# Patient Record
Sex: Female | Born: 1945 | Race: Black or African American | Hispanic: No | Marital: Married
Health system: Southern US, Community
[De-identification: ages and names within clinical notes are randomized; demographics above are authoritative.]

---

## 2015-06-15 ENCOUNTER — Other Ambulatory Visit (HOSPITAL_COMMUNITY): Payer: Medicare Other

## 2015-06-15 ENCOUNTER — Inpatient Hospital Stay
Admission: AD | Admit: 2015-06-15 | Discharge: 2015-07-25 | Disposition: A | Discharge status: Home Health | Payer: Medicare Other | Source: Ambulatory Visit | Attending: Internal Medicine | Admitting: Internal Medicine

## 2015-06-15 DIAGNOSIS — K567 Ileus, unspecified: Secondary | ICD-10-CM

## 2015-06-15 DIAGNOSIS — Z09 Encounter for follow-up examination after completed treatment for conditions other than malignant neoplasm: Secondary | ICD-10-CM

## 2015-06-15 DIAGNOSIS — Z95828 Presence of other vascular implants and grafts: Secondary | ICD-10-CM

## 2015-06-15 DIAGNOSIS — Z9911 Dependence on respirator [ventilator] status: Secondary | ICD-10-CM

## 2015-06-15 DIAGNOSIS — Z431 Encounter for attention to gastrostomy: Secondary | ICD-10-CM

## 2015-06-15 DIAGNOSIS — N390 Urinary tract infection, site not specified: Secondary | ICD-10-CM

## 2015-06-15 DIAGNOSIS — G4733 Obstructive sleep apnea (adult) (pediatric): Secondary | ICD-10-CM

## 2015-06-15 DIAGNOSIS — J9620 Acute and chronic respiratory failure, unspecified whether with hypoxia or hypercapnia: Secondary | ICD-10-CM

## 2015-06-15 DIAGNOSIS — R1310 Dysphagia, unspecified: Secondary | ICD-10-CM

## 2015-06-15 DIAGNOSIS — J690 Pneumonitis due to inhalation of food and vomit: Secondary | ICD-10-CM

## 2015-06-15 DIAGNOSIS — R627 Adult failure to thrive: Secondary | ICD-10-CM

## 2015-06-15 DIAGNOSIS — Z4659 Encounter for fitting and adjustment of other gastrointestinal appliance and device: Secondary | ICD-10-CM

## 2015-06-15 DIAGNOSIS — J9690 Respiratory failure, unspecified, unspecified whether with hypoxia or hypercapnia: Secondary | ICD-10-CM

## 2015-06-15 DIAGNOSIS — I2699 Other pulmonary embolism without acute cor pulmonale: Secondary | ICD-10-CM

## 2015-06-15 DIAGNOSIS — K639 Disease of intestine, unspecified: Secondary | ICD-10-CM

## 2015-06-15 DIAGNOSIS — Z931 Gastrostomy status: Secondary | ICD-10-CM

## 2015-06-15 DIAGNOSIS — E46 Unspecified protein-calorie malnutrition: Secondary | ICD-10-CM

## 2015-06-15 LAB — BLOOD GAS, ARTERIAL
ACID-BASE EXCESS: 5.2 mmol/L — AB (ref 0.0–2.0)
Bicarbonate: 27.8 mEq/L — ABNORMAL HIGH (ref 20.0–24.0)
FIO2: 0.3
LHR: 14 {breaths}/min
O2 SAT: 94.2 %
PCO2 ART: 30.7 mmHg — AB (ref 35.0–45.0)
PEEP: 5 cmH2O
PH ART: 7.565 — AB (ref 7.350–7.450)
Patient temperature: 98.6
TCO2: 28.7 mmol/L (ref 0–100)
VT: 570 mL
pO2, Arterial: 62.7 mmHg — ABNORMAL LOW (ref 80.0–100.0)

## 2015-06-16 ENCOUNTER — Other Ambulatory Visit (HOSPITAL_COMMUNITY): Payer: Medicare Other

## 2015-06-16 DIAGNOSIS — J9600 Acute respiratory failure, unspecified whether with hypoxia or hypercapnia: Secondary | ICD-10-CM | POA: Diagnosis not present

## 2015-06-16 DIAGNOSIS — I2609 Other pulmonary embolism with acute cor pulmonale: Secondary | ICD-10-CM

## 2015-06-16 DIAGNOSIS — N390 Urinary tract infection, site not specified: Secondary | ICD-10-CM

## 2015-06-16 DIAGNOSIS — J9690 Respiratory failure, unspecified, unspecified whether with hypoxia or hypercapnia: Secondary | ICD-10-CM | POA: Insufficient documentation

## 2015-06-16 DIAGNOSIS — J690 Pneumonitis due to inhalation of food and vomit: Secondary | ICD-10-CM

## 2015-06-16 DIAGNOSIS — R627 Adult failure to thrive: Secondary | ICD-10-CM

## 2015-06-16 DIAGNOSIS — G4733 Obstructive sleep apnea (adult) (pediatric): Secondary | ICD-10-CM

## 2015-06-16 DIAGNOSIS — Z9911 Dependence on respirator [ventilator] status: Secondary | ICD-10-CM

## 2015-06-16 DIAGNOSIS — I2699 Other pulmonary embolism without acute cor pulmonale: Secondary | ICD-10-CM

## 2015-06-16 LAB — BLOOD GAS, ARTERIAL
Acid-Base Excess: 2.4 mmol/L — ABNORMAL HIGH (ref 0.0–2.0)
Bicarbonate: 26.1 mEq/L — ABNORMAL HIGH (ref 20.0–24.0)
FIO2: 0.35
LHR: 14 {breaths}/min
MECHVT: 420 mL
O2 Saturation: 98.7 %
PH ART: 7.448 (ref 7.350–7.450)
Patient temperature: 98.6
TCO2: 27.3 mmol/L (ref 0–100)
pCO2 arterial: 38.3 mmHg (ref 35.0–45.0)
pO2, Arterial: 121 mmHg — ABNORMAL HIGH (ref 80.0–100.0)

## 2015-06-16 LAB — COMPREHENSIVE METABOLIC PANEL
ALBUMIN: 3.1 g/dL — AB (ref 3.5–5.0)
ALT: 60 U/L — AB (ref 14–54)
AST: 96 U/L — AB (ref 15–41)
Alkaline Phosphatase: 48 U/L (ref 38–126)
Anion gap: 10 (ref 5–15)
BILIRUBIN TOTAL: 0.8 mg/dL (ref 0.3–1.2)
BUN: 12 mg/dL (ref 6–20)
CHLORIDE: 98 mmol/L — AB (ref 101–111)
CO2: 27 mmol/L (ref 22–32)
Calcium: 8.6 mg/dL — ABNORMAL LOW (ref 8.9–10.3)
Creatinine, Ser: 0.59 mg/dL (ref 0.44–1.00)
GFR calc Af Amer: >60 mL/min (ref >60)
GFR calc non Af Amer: >60 mL/min (ref >60)
Glucose, Bld: 82 mg/dL (ref 65–99)
POTASSIUM: 2.7 mmol/L — AB (ref 3.5–5.1)
Sodium: 135 mmol/L (ref 135–145)
TOTAL PROTEIN: 6 g/dL — AB (ref 6.5–8.1)

## 2015-06-16 LAB — CBC
HEMATOCRIT: 28.2 % — AB (ref 36.0–46.0)
Hemoglobin: 8.7 g/dL — ABNORMAL LOW (ref 12.0–15.0)
MCH: 26.6 pg (ref 26.0–34.0)
MCHC: 30.9 g/dL (ref 30.0–36.0)
MCV: 86.2 fL (ref 78.0–100.0)
PLATELETS: 301 10*3/uL (ref 150–400)
RBC: 3.27 MIL/uL — AB (ref 3.87–5.11)
RDW: 14.1 % (ref 11.5–15.5)
WBC: 6 10*3/uL (ref 4.0–10.5)

## 2015-06-16 LAB — PROTIME-INR
INR: 1.24 (ref 0.00–1.49)
PROTHROMBIN TIME: 15.7 s — AB (ref 11.6–15.2)

## 2015-06-16 NOTE — Consult Note (Signed)
Name: Kaylee Olsen MRN: [id number] DOB: [birth year]    ADMISSION DATE:  06/15/2015 CONSULTATION DATE:  10/28  REFERRING MD :  [facility]  CHIEF COMPLAINT:  FTT , VDRF  BRIEF PATIENT DESCRIPTION: 69 yo female intubated  SIGNIFICANT EVENTS  10/28 tx to [facility]  STUDIES:  pcxr rt base infiltrate / atx   HISTORY OF PRESENT ILLNESS:  69 yo aaf who developed sob on 04/11/15 and was found to have PE and required intubation and treatment at [facility]. Dx with OSA post extubation. She required 6 weeks of rehab and never fully recovered. Home for4 days and her cpap was never set up. She desaturated and was readmitted and intubated for second time 04/09/15. Extubated 10/25 and required reintubation 10/27 and transferred to [facility] 10/27 and PCCM consulted 10/28 for vent management. She will need early   PAST MEDICAL HISTORY :     Ventilator dependence ([place]). intubated x 3 over last 3 months. extubated 10/24 ab=nd reintubated 06/14/20   Pulmonary embolus (HCC)dx in august 2016. [facility]QU[facility]UBATION AT [facility] [place]   Aspiration pneumonia ([place]) aspirated last intubation 10/27   OSA (obstructive sleep apnea) needs cpap if extubated    FTT (failure to thrive) in adult, continual decline sine august 2016   UTI (lower urinary tract infection) with e coli   Prior to Admission medications   Reviewed    Allergies no known allergies  FAMILY HISTORY:  family history is not on file.unable to obtain and NOT on transfer summary SOCIAL HISTORY:  never smoker unknown, unable  REVIEW OF SYSTEMS:   Unable ETT, no family  SUBJECTIVE: remains vented, alkalotic  VITAL SIGNS:  112/62  72 sr  rr 14 with vent 98.9 sats 100%  PHYSICAL EXAMINATION: General: Frail elderly AAF , on vent  Neuro: Follows commands weakly, unable to track, mae x 4 HEENT:  No JVD LAN Cardiovascular:  HSR RRR Lungs:  Decreased bs bases Abdomen: Soft + bs Musculoskeletal:  Wasted musculature, early arm contractures  Skin:  Warm and  dry.smvs   No results for input(s): NA, K, CL, CO2, BUN, CREATININE, GLUCOSE in the last 168 hours.  Recent Labs Lab 06/16/15 0528  HGB 8.7*  HCT 28.2*  WBC 6.0  PLT 301   Dg Chest Port 1 View  06/16/2015  CLINICAL DATA:  Respiratory failure, clinical pneumonia, intubated patient. EXAM: PORTABLE CHEST 1 VIEW COMPARISON:  None in PACs FINDINGS: The left lung is well-expanded and clear. On the right increased airspace opacity obscures the hemidiaphragm and extends toward the right infrahilar region. The heart and pulmonary vascularity are normal. The mediastinum is normal in width. The endotracheal tube tip earlier X 3.7 cm above the carina. The esophagogastric tube tip projects below the inferior margin of the image. The right-sided PICC line tip projects over the midportion of the SVC. IMPRESSION: Right lower lobe atelectasis or pneumonia. The support tubes are in reasonable position. Electronically Signed   By: David  Martinique M.D.   On: 06/16/2015 07:18   Dg Abd Portable 1v  06/15/2015  CLINICAL DATA:  Nasogastric tube placement. EXAM: PORTABLE ABDOMEN - 1 VIEW COMPARISON:  None. FINDINGS: Normal bowel gas pattern. Nasogastric tube tip in the region of the gastric pylorus. Prominent stool in the rectum. Unremarkable bones. IMPRESSION: 1. Nasogastric tube tip in the region of the gastric pylorus. 2. Prominent stool in the rectum. Electronically Signed   By: Claudie Revering M.D.   On: 06/15/2015 17:38    ASSESSMENT  Ventilator dependence ([place]). intubated x 3 over last 3 months. extubated 10/24 abnd reintubated 06/14/20   Pulmonary embolus (HCC)dx in august 2016. Required intubation at [facility]. 6 weeks of rehab   Aspiration pneumonia ([place]) aspirated last intubation 10/27   OSA (obstructive sleep apnea) needs cpap if extubated    FTT (failure to thrive) in adult, continual decline sine august 2016   UTI (lower urinary tract infection) with e coli Supra nuclear palsy     PLAN: Wean  diprivan to off Suggest early tracheostomy due profound weakness, proven inability to extubate successfully  Continue anticoagulation for pe  Abx per [facility] for aspiration  Will need cpap if extubated  PT/OT that [facility] does so well in apparent CIP  UTI treatment per [facility].   Richardson Landry Minor ACNP Maryanna Shape PCCM Pager [phone] till 3 pm If no answer page [phone] 06/16/2015, 10:27 AM   .STAFF NOTE: I, Merrie Roof, MD FACP have personally reviewed patient's available data, including medical history, events of note, physical examination and test results as part of my evaluation. I have discussed with resident/NP and other care providers such as pharmacist, RN and RRT. In addition, I personally evaluated patient and elicited key findings of: I interviewed husband, she has coarse BS, she does not follow commands, no edema on examination, ronchi worse rt base and reduced, he described her ambulating in between 1-2 extubation attempt, he also does not describe a grave prognosis about her SNP, seems the second extubation failure was despite BIPAP use, ABg reviewed and pcxr, abx per primary, reduce Tv by 1 cc/kg to 420, repeat abg, then sbt planned cpap 5 ps 5, goal 2 hr, NO extubation planned, she requires trach, PCCM not doing trach now at [facility] for now, please call ENT, would follow rt base infiltrate, rass goal 0, need copy of MRI brain and neuro evalution to confirm prognosis SNP, d/w RT, repeat abg for alk with above reduction MV The patient is critically ill with multiple organ systems failure and requires high complexity decision making for assessment and support, frequent evaluation and titration of therapies, application of advanced monitoring technologies and extensive interpretation of multiple databases.   Critical Care Time devoted to patient care services described in this note is35 Minutes. This time reflects time of care of this signee: Merrie Roof, MD FACP. This critical care time  does not reflect procedure time, or teaching time or supervisory time of PA/NP/Med student/Med Resident etc but could involve care discussion time. Rest per NP/medical resident whose note is outlined above and that I agree with   Lavon Paganini. Titus Mould, MD, [place] Pgr: [place] [facility] 06/16/2015 10:42 AM

## 2015-06-17 LAB — BASIC METABOLIC PANEL
ANION GAP: 7 (ref 5–15)
BUN: 10 mg/dL (ref 6–20)
CALCIUM: 8.3 mg/dL — AB (ref 8.9–10.3)
CHLORIDE: 100 mmol/L — AB (ref 101–111)
CO2: 27 mmol/L (ref 22–32)
CREATININE: 0.54 mg/dL (ref 0.44–1.00)
GFR calc Af Amer: >60 mL/min (ref >60)
GLUCOSE: 85 mg/dL (ref 65–99)
POTASSIUM: 3.2 mmol/L — AB (ref 3.5–5.1)
Sodium: 134 mmol/L — ABNORMAL LOW (ref 135–145)

## 2015-06-17 LAB — MAGNESIUM: Magnesium: 1.8 mg/dL (ref 1.7–2.4)

## 2015-06-18 LAB — BASIC METABOLIC PANEL
Anion gap: 9 (ref 5–15)
BUN: 11 mg/dL (ref 6–20)
CHLORIDE: 99 mmol/L — AB (ref 101–111)
CO2: 29 mmol/L (ref 22–32)
Calcium: 9 mg/dL (ref 8.9–10.3)
Creatinine, Ser: 0.55 mg/dL (ref 0.44–1.00)
Glucose, Bld: 100 mg/dL — ABNORMAL HIGH (ref 65–99)
POTASSIUM: 4.1 mmol/L (ref 3.5–5.1)
SODIUM: 137 mmol/L (ref 135–145)

## 2015-06-19 ENCOUNTER — Other Ambulatory Visit (HOSPITAL_COMMUNITY): Payer: Medicare Other

## 2015-06-19 DIAGNOSIS — J9620 Acute and chronic respiratory failure, unspecified whether with hypoxia or hypercapnia: Secondary | ICD-10-CM | POA: Diagnosis not present

## 2015-06-19 DIAGNOSIS — G4733 Obstructive sleep apnea (adult) (pediatric): Secondary | ICD-10-CM | POA: Diagnosis not present

## 2015-06-19 DIAGNOSIS — Z9911 Dependence on respirator [ventilator] status: Secondary | ICD-10-CM | POA: Diagnosis not present

## 2015-06-19 DIAGNOSIS — J690 Pneumonitis due to inhalation of food and vomit: Secondary | ICD-10-CM | POA: Diagnosis not present

## 2015-06-19 LAB — BASIC METABOLIC PANEL
Anion gap: 8 (ref 5–15)
BUN: 14 mg/dL (ref 6–20)
CHLORIDE: 100 mmol/L — AB (ref 101–111)
CO2: 28 mmol/L (ref 22–32)
CREATININE: 0.47 mg/dL (ref 0.44–1.00)
Calcium: 9.1 mg/dL (ref 8.9–10.3)
GFR calc Af Amer: >60 mL/min (ref >60)
GFR calc non Af Amer: >60 mL/min (ref >60)
GLUCOSE: 99 mg/dL (ref 65–99)
POTASSIUM: 3.9 mmol/L (ref 3.5–5.1)
SODIUM: 136 mmol/L (ref 135–145)

## 2015-06-19 LAB — CBC
HCT: 28.8 % — ABNORMAL LOW (ref 36.0–46.0)
HEMOGLOBIN: 8.8 g/dL — AB (ref 12.0–15.0)
MCH: 26.7 pg (ref 26.0–34.0)
MCHC: 30.6 g/dL (ref 30.0–36.0)
MCV: 87.3 fL (ref 78.0–100.0)
Platelets: 324 10*3/uL (ref 150–400)
RBC: 3.3 MIL/uL — AB (ref 3.87–5.11)
RDW: 14.8 % (ref 11.5–15.5)
WBC: 4.8 10*3/uL (ref 4.0–10.5)

## 2015-06-19 LAB — MAGNESIUM: Magnesium: 1.9 mg/dL (ref 1.7–2.4)

## 2015-06-19 LAB — BRAIN NATRIURETIC PEPTIDE: B Natriuretic Peptide: 33.4 pg/mL (ref 0.0–100.0)

## 2015-06-19 NOTE — Progress Notes (Signed)
PCCM PROGRESS NOTE  ADMISSION DATE: 06/15/2015 CONSULT DATE: 06/16/2015 REFERRING PROVIDER: Hijazi  CC: Respiratory failure  SUBJECTIVE: She denies chest, or abdominal pain.  Not able to tolerate pressure support weaning this AM.  OBJECTIVE: Vitals: RR 14, Vt 420, FiO2 28%, PEEP5, BP 128/69, HR 71, SpO2 100%  General: ill appearing HEENT: ETT in place Cardiac: regular Chest: scattered rhonchi Abd: soft, non tender Ext: no edema Neuro: follows commands, moves extremities Skin: no rashes   CMP Latest Ref Rng 06/19/2015 06/18/2015 06/17/2015  Glucose 65 - 99 mg/dL 99 161(W100(H) 85  BUN 6 - 20 mg/dL 14 11 10   Creatinine 0.44 - 1.00 mg/dL 9.600.47 4.540.55 0.980.54  Sodium 135 - 145 mmol/L 136 137 134(L)  Potassium 3.5 - 5.1 mmol/L 3.9 4.1 3.2(L)  Chloride 101 - 111 mmol/L 100(L) 99(L) 100(L)  CO2 22 - 32 mmol/L 28 29 27   Calcium 8.9 - 10.3 mg/dL 9.1 9.0 1.1(B8.3(L)  Total Protein 6.5 - 8.1 g/dL - - -  Total Bilirubin 0.3 - 1.2 mg/dL - - -  Alkaline Phos 38 - 126 U/L - - -  AST 15 - 41 U/L - - -  ALT 14 - 54 U/L - - -     CBC Latest Ref Rng 06/19/2015 06/16/2015  WBC 4.0 - 10.5 K/uL 4.8 6.0  Hemoglobin 12.0 - 15.0 g/dL 1.4(N8.8(L) 8.2(N8.7(L)  Hematocrit 36.0 - 46.0 % 28.8(L) 28.2(L)  Platelets 150 - 400 K/uL 324 301     Dg Chest Port 1 View  06/19/2015  CLINICAL DATA:  Respiratory failure. EXAM: PORTABLE CHEST 1 VIEW COMPARISON:  06/16/2015 . FINDINGS: Endotracheal tube and NG tube in stable position. Right PICC line stable position. Mediastinum and hilar structures are stable. Left lower lobe atelectasis and/or through noted. Interim partial clearing of right lower lobe atelectasis. Small left pleural effusion. No pneumothorax. IMPRESSION: 1. Lines and tubes in stable position. 2. Left lower lobe atelectasis and/or infiltrate with small left pleural effusion. Interim partial clearing of right lower lobe atelectasis. Electronically Signed   By: Maisie Fushomas  Register   On: 06/19/2015 07:21     STUDIES:  EVENTS: 08/23 Admit [facility] with PE, VDRF 09/21 Re-intubated 10/25 Extubated 10/27 Re-intubated  10[facility]  DISCUSSION: 69 yo female with respiratory failure in setting of PE, failure to thrive, UTI, aspiration pneumonia, supranuclear palsy.  ASSESSMENT/PLAN:  Acute on chronic respiratory failure. Failure to wean from ventilator. Hx of OSA. Plan: - discussed tracheostomy with pt's husband >> he is agreeable - have asked primary team to arrange for ENT consult to set up tracheostomy - pressure support wean as tolerated - f/u CXR intermittently  Aspiration pneumonia. Plan: - levaquin per primary team  Supranuclear palsy. Hx of PE. Deconditioning with failure to thrive. Plan: - per primary team.  Updated pt's husband at bedside.  D/w Dr. Stanton KidneyFortkort.   Coralyn HellingVineet Cecilee Rosner, MD [facility] 06/19/2015, 9:58 AM Pager:  [phone] After 3pm call: [phone]

## 2015-06-21 ENCOUNTER — Other Ambulatory Visit (HOSPITAL_COMMUNITY): Payer: Medicare Other

## 2015-06-22 DIAGNOSIS — I2609 Other pulmonary embolism with acute cor pulmonale: Secondary | ICD-10-CM | POA: Diagnosis not present

## 2015-06-22 DIAGNOSIS — J690 Pneumonitis due to inhalation of food and vomit: Secondary | ICD-10-CM | POA: Diagnosis not present

## 2015-06-22 DIAGNOSIS — Z9911 Dependence on respirator [ventilator] status: Secondary | ICD-10-CM | POA: Diagnosis not present

## 2015-06-22 DIAGNOSIS — J9620 Acute and chronic respiratory failure, unspecified whether with hypoxia or hypercapnia: Secondary | ICD-10-CM | POA: Diagnosis not present

## 2015-06-22 NOTE — Consult Note (Unsigned)
NAMNanci Pina:  Faulk, Kaylee                  ACCOUNT NO.:  [id number]  MEDICAL RECORD NO.:  [id number]  LOCATION:                               FACILITY:  [facility]  PHYSICIAN:  Kristine GarbeChristopher E. Ezzard StandingNewman, M.D.DATE OF BIRTH:  [birth year]  DATE OF CONSULTATION:  06/20/2015 DATE OF DISCHARGE:                                CONSULTATION   REASON FOR CONSULTATION:  Evaluate the patient for tracheostomy.  INDICATION/BRIEF HISTORY:  Kaylee Olsen is a 69 year old female who has had recent history of pulmonary embolus and pneumonia with previous history of obstructive sleep apnea and supranuclear palsy.  She was initially tried on BiPAP at outside hospital, but eventually required intubation.  Several attempts at extubation were unsuccessful requiring re-intubation.  The [facility], intubated.  Tracheostomy was recommended. The patient is presently on Lovenox subcu b.i.d., hemoglobin of 8.8.  On exam at bedside, the patient is intubated on the vent.  Neck is moderate-sized with the trach midline with no neck masses noted.  Discussed with husband concerning the patient's medical history and need for tracheotomy and he understands.  IMPRESSION: 1. Vent-dependent respiratory failure. 2. Obstructive sleep apnea. 3. Supranuclear palsy. 4. History of pulmonary embolism and pneumonia.  PLAN:  We will plan on scheduling the patient for tracheotomy later this week, presently scheduled for Friday at 9:30 a.m.  We will need to hold Lovenox 24 hours prior to tracheotomy.          ______________________________ Kristine Garbehristopher E. Ezzard StandingNewman, M.D.     CEN/MEDQ  D:  06/20/2015  T:  06/21/2015  Job:  [id number]

## 2015-06-22 NOTE — Progress Notes (Signed)
PCCM PROGRESS NOTE  ADMISSION DATE: 06/15/2015 CONSULT DATE: 06/16/2015 REFERRING PROVIDER: Hijazi  CC: Respiratory failure  SUBJECTIVE: She denies chest, or abdominal pain.  Not able to tolerate pressure support weaning this AM.  OBJECTIVE: Vitals: Reviewed at bedside - abnormal values discussed in A/P.   General: ill appearing, NAD on vent  HEENT: ETT in place Cardiac: regular Chest: resps even no labored on vent, scattered rhonchi Abd: soft, non tender Ext: no edema Neuro: follows commands, moves extremities, nods appropriately Skin: no rashes   CMP Latest Ref Rng 06/19/2015 06/18/2015 06/17/2015  Glucose 65 - 99 mg/dL 99 960(A) 85  BUN 6 - 20 mg/dL Creatinine 0.44 - 1.00 mg/dL 5.40 9.81 1.91  Sodium 135 - 145 mmol/L 136 137 134(L)  Potassium 3.5 - 5.1 mmol/L 3.9 4.1 3.2(L)  Chloride 101 - 111 mmol/L 100(L) 99(L) 100(L)  CO2 22 - 32 mmol/L Calcium 8.9 - 10.3 mg/dL 9.1 9.0 4.7(W)  Total Protein 6.5 - 8.1 g/dL - - -  Total Bilirubin 0.3 - 1.2 mg/dL - - -  Alkaline Phos 38 - 126 U/L - - -  AST 15 - 41 U/L - - -  ALT 14 - 54 U/L - - -     CBC Latest Ref Rng 06/19/2015 06/16/2015  WBC 4.0 - 10.5 K/uL 4.8 6.0  Hemoglobin 12.0 - 15.0 g/dL 2.9(F) 6.2(Z)  Hematocrit 36.0 - 46.0 % 28.8(L) 28.2(L)  Platelets 150 - 400 K/uL 324 301     Dg Chest Port 1 View  06/21/2015  CLINICAL DATA:  69 year old female with a history of respiratory failure EXAM: PORTABLE CHEST 1 VIEW COMPARISON:  06/19/2015 FINDINGS: Cardiomediastinal silhouette unchanged in size and contour. Re- demonstration of endotracheal tube terminating suitably above the carina, approximately 3 cm. Gastric tube projects over the mediastinum, and terminates in the left upper quadrant. Low lung volumes with mixed linear and airspace opacities, similar to the comparison. No pneumothorax. IMPRESSION: Unchanged position of endotracheal tube, terminating suitably above the carina. Unchanged gastric  tube. Similar appearance of mixed interstitial and airspace disease of the bilateral lungs. Signed, Yvone Neu. Loreta Ave, DO [facility] Electronically Signed   By: Gilmer Mor D.O.   On: 06/21/2015 15:13   Dg Abd Portable 1v  06/21/2015  CLINICAL DATA:  Placement of orogastric tube EXAM: PORTABLE ABDOMEN - 1 VIEW COMPARISON:  None. FINDINGS: Orogastric tube tip and side port are in the stomach. Rectal thermometer is in place. There is no appreciable bowel dilatation or air-fluid levels. Note that there is fold thickening in a portion of the transverse colon. No free air is seen on this supine examination. There is atelectasis in the left lung base. IMPRESSION: Orogastric tube tip and side port in stomach. No obstruction or free air is evident. Note that there is mild wall thickening in portions of the mid to distal transverse colon. This finding could indicate a degree of inflammation or even early ischemia. Close clinical surveillance advised. This finding may warrant contrast enhanced CT abdomen and pelvis if symptoms and wall thickening persists radiographically. Electronically Signed   By: Bretta Bang III M.D.   On: 06/21/2015 15:13    STUDIES:  EVENTS: 08/23 Admit [facility] with PE, VDRF 09/21 Re-intubated 10/25 Extubated 10/27 Re-intubated  10/28 To [facility]  DISCUSSION: 69 yo female with respiratory failure in setting of PE, failure to thrive, UTI, aspiration pneumonia, supranuclear palsy.  ASSESSMENT/PLAN:  Acute on chronic  respiratory failure. Failure to wean from ventilator. Hx of OSA. Plan: - plan for ENT trach 11/4 - pressure support wean as tol, progress to attempts at ATC wean once trach in place  - f/u CXR intermittently  Aspiration pneumonia. Plan: - levaquin per primary team  Supranuclear palsy. Hx of PE. Deconditioning with failure to thrive. Plan: - per primary team.    Kaylee DressKaty Whiteheart, NP 06/22/2015  9:27  AM Pager: [phone] or [phone]  Attending Note:  I have examined patient, reviewed labs, studies and notes. I have discussed the case with Jasper RilingK Olsen, and I agree with the data and plans as amended above.   Levy Pupaobert Brentton Wardlow, MD, PhD 06/22/2015, 10:19 AM [place] [facility] [phone] or if no answer [phone]

## 2015-06-23 ENCOUNTER — Other Ambulatory Visit (HOSPITAL_COMMUNITY): Payer: Medicare Other

## 2015-06-23 ENCOUNTER — Inpatient Hospital Stay
Admission: AD | Disposition: A | Discharge status: Home Health | Payer: Self-pay | Source: Ambulatory Visit | Attending: Internal Medicine

## 2015-06-23 ENCOUNTER — Encounter (HOSPITAL_COMMUNITY): Payer: Medicare Other | Admitting: Anesthesiology

## 2015-06-23 DIAGNOSIS — K639 Disease of intestine, unspecified: Secondary | ICD-10-CM | POA: Insufficient documentation

## 2015-06-23 HISTORY — PX: TRACHEOSTOMY TUBE PLACEMENT: SHX814

## 2015-06-23 LAB — BASIC METABOLIC PANEL
Anion gap: 9 (ref 5–15)
BUN: 11 mg/dL (ref 6–20)
CALCIUM: 9.2 mg/dL (ref 8.9–10.3)
CO2: 28 mmol/L (ref 22–32)
CREATININE: 0.54 mg/dL (ref 0.44–1.00)
Chloride: 97 mmol/L — ABNORMAL LOW (ref 101–111)
GFR calc Af Amer: >60 mL/min (ref >60)
GLUCOSE: 97 mg/dL (ref 65–99)
Potassium: 3.9 mmol/L (ref 3.5–5.1)
Sodium: 134 mmol/L — ABNORMAL LOW (ref 135–145)

## 2015-06-23 LAB — CBC
HCT: 29.5 % — ABNORMAL LOW (ref 36.0–46.0)
HEMOGLOBIN: 9.2 g/dL — AB (ref 12.0–15.0)
MCH: 27 pg (ref 26.0–34.0)
MCHC: 31.2 g/dL (ref 30.0–36.0)
MCV: 86.5 fL (ref 78.0–100.0)
PLATELETS: 362 10*3/uL (ref 150–400)
RBC: 3.41 MIL/uL — ABNORMAL LOW (ref 3.87–5.11)
RDW: 14.9 % (ref 11.5–15.5)
WBC: 6.2 10*3/uL (ref 4.0–10.5)

## 2015-06-23 SURGERY — CREATION, TRACHEOSTOMY
Anesthesia: General | Site: Neck

## 2015-06-23 MED ORDER — ONDANSETRON HCL 4 MG/2ML IJ SOLN
INTRAMUSCULAR | Status: DC | PRN
  Administered 2015-06-23: 4 mg via INTRAVENOUS

## 2015-06-23 MED ORDER — LIDOCAINE HCL (CARDIAC) 20 MG/ML IV SOLN
INTRAVENOUS | Status: DC | PRN
  Administered 2015-06-23: 60 mg via INTRAVENOUS

## 2015-06-23 MED ORDER — PROPOFOL 10 MG/ML IV BOLUS
INTRAVENOUS | Status: DC | PRN
  Administered 2015-06-23: 30 mg via INTRAVENOUS
  Administered 2015-06-23: 20 mg via INTRAVENOUS
  Administered 2015-06-23: 30 mg via INTRAVENOUS

## 2015-06-23 MED ORDER — LIDOCAINE-EPINEPHRINE 1 %-1:100000 IJ SOLN
INTRAMUSCULAR | Status: DC | PRN
  Administered 2015-06-23: 5 mL

## 2015-06-23 MED ORDER — LACTATED RINGERS IV SOLN
INTRAVENOUS | Status: DC | PRN
  Administered 2015-06-23: 10:00:00 via INTRAVENOUS

## 2015-06-23 MED ORDER — FENTANYL CITRATE (PF) 100 MCG/2ML IJ SOLN
INTRAMUSCULAR | Status: DC | PRN
  Administered 2015-06-23: 50 ug via INTRAVENOUS

## 2015-06-23 MED ORDER — 0.9 % SODIUM CHLORIDE (POUR BTL) OPTIME
TOPICAL | Status: DC | PRN
  Administered 2015-06-23: 1000 mL

## 2015-06-23 SURGICAL SUPPLY — 42 items
ATTRACTOMAT 16X20 MAGNETIC DRP (DRAPES) IMPLANT
BLADE 10 SAFETY STRL DISP (BLADE) ×3 IMPLANT
BLADE SURG 15 STRL LF DISP TIS (BLADE) ×1 IMPLANT
BLADE SURG 15 STRL SS (BLADE) ×2
CLEANER TIP ELECTROSURG 2X2 (MISCELLANEOUS) ×3 IMPLANT
COVER SURGICAL LIGHT HANDLE (MISCELLANEOUS) ×3 IMPLANT
DRAPE PROXIMA HALF (DRAPES) IMPLANT
ELECT COATED BLADE 2.86 ST (ELECTRODE) ×3 IMPLANT
ELECT REM PT RETURN 9FT ADLT (ELECTROSURGICAL) ×3
ELECTRODE REM PT RTRN 9FT ADLT (ELECTROSURGICAL) ×1 IMPLANT
GAUZE SPONGE 4X4 16PLY XRAY LF (GAUZE/BANDAGES/DRESSINGS) ×3 IMPLANT
GLOVE BIO SURGEON STRL SZ7 (GLOVE) ×3 IMPLANT
GLOVE BIOGEL PI IND STRL 7.0 (GLOVE) ×1 IMPLANT
GLOVE BIOGEL PI INDICATOR 7.0 (GLOVE) ×2
GLOVE SS BIOGEL STRL SZ 7.5 (GLOVE) ×2 IMPLANT
GLOVE SUPERSENSE BIOGEL SZ 7.5 (GLOVE) ×4
GLOVE SURG SS PI 7.0 STRL IVOR (GLOVE) ×3 IMPLANT
GOWN STRL REUS W/ TWL LRG LVL3 (GOWN DISPOSABLE) ×2 IMPLANT
GOWN STRL REUS W/ TWL XL LVL3 (GOWN DISPOSABLE) ×1 IMPLANT
GOWN STRL REUS W/TWL LRG LVL3 (GOWN DISPOSABLE) ×4
GOWN STRL REUS W/TWL XL LVL3 (GOWN DISPOSABLE) ×2
HOLDER TRACH TUBE VELCRO 19.5 (MISCELLANEOUS) ×3 IMPLANT
KIT BASIN OR (CUSTOM PROCEDURE TRAY) ×3 IMPLANT
KIT ROOM TURNOVER OR (KITS) ×3 IMPLANT
KIT SUCTION CATH 14FR (SUCTIONS) ×3 IMPLANT
NEEDLE HYPO 25GX1X1/2 BEV (NEEDLE) IMPLANT
NEEDLE HYPO 25X1 1.5 SAFETY (NEEDLE) ×3 IMPLANT
NS IRRIG 1000ML POUR BTL (IV SOLUTION) ×3 IMPLANT
PACK EENT II TURBAN DRAPE (CUSTOM PROCEDURE TRAY) ×3 IMPLANT
PAD ARMBOARD 7.5X6 YLW CONV (MISCELLANEOUS) ×6 IMPLANT
PENCIL BUTTON HOLSTER BLD 10FT (ELECTRODE) ×3 IMPLANT
SPONGE DRAIN TRACH 4X4 STRL 2S (GAUZE/BANDAGES/DRESSINGS) ×3 IMPLANT
SPONGE INTESTINAL PEANUT (DISPOSABLE) ×3 IMPLANT
SUT SILK 2 0 SH CR/8 (SUTURE) ×3 IMPLANT
SUT SILK 3 0 TIES 10X30 (SUTURE) ×3 IMPLANT
SYR 20CC LL (SYRINGE) ×3 IMPLANT
SYR CONTROL 10ML LL (SYRINGE) ×3 IMPLANT
TUBE CONNECTING 12'X1/4 (SUCTIONS) ×1
TUBE CONNECTING 12X1/4 (SUCTIONS) ×2 IMPLANT
TUBE TRACH SHILEY  6 DIST  CUF (TUBING) ×3 IMPLANT
TUBE TRACH SHILEY 10 DIST CUFF (TUBING) IMPLANT
TUBE TRACH SHILEY 8 DIST CUF (TUBING) IMPLANT

## 2015-06-23 NOTE — Brief Op Note (Signed)
06/15/2015 - 06/23/2015  10:14 AM  PATIENT:  Kaylee Olsen  69 y.o. female  PRE-OPERATIVE DIAGNOSIS:  respiratory failure  POST-OPERATIVE DIAGNOSIS:  respiratory failure  PROCEDURE:  Procedure(s): TRACHEOSTOMY (N/A)   # 6 [place]  SURGEON:  Surgeon(s) and Role:    * Drema Halonhristopher E Newman, MD - Primary  PHYSICIAN ASSISTANT:   ASSISTANTS: none   ANESTHESIA:   general  EBL:     BLOOD ADMINISTERED:none  DRAINS: none   LOCAL MEDICATIONS USED:  LIDOCAINE with EPI 5 cc  SPECIMEN:  No Specimen  DISPOSITION OF SPECIMEN:  N/A  COUNTS:  YES  TOURNIQUET:  * No tourniquets in log *  DICTATION: .Other Dictation: Dictation Number [id number]  PLAN OF CARE: Discharge to home after PACU  PATIENT DISPOSITION:  PACU - hemodynamically stable.   Delay start of Pharmacological VTE agent (>24hrs) due to surgical blood loss or risk of bleeding: not applicable

## 2015-06-23 NOTE — Progress Notes (Signed)
[facility] w/ no apparent complications and handed pt off to [facility]. VSS, trach secure.

## 2015-06-23 NOTE — Progress Notes (Signed)
Pt s/p trach from OR, now in PACU for observation.  Pt placed on vent setting that [facility] was [facility] prior to procedure.  Pt appears to be tol well, VSS, no distress noted.  Obturator at bedside. [facility] aware.

## 2015-06-23 NOTE — Interval H&P Note (Signed)
History and Physical Interval Note:  06/23/2015 9:17 AM  Kaylee Olsen  has presented today for surgery, with the diagnosis of respiratory failure  The various methods of treatment have been discussed with the patient and family. After consideration of risks, benefits and other options for treatment, the patient has consented to  Procedure(s): TRACHEOSTOMY (N/A) as a surgical intervention .  The patient's history has been reviewed, patient examined, no change in status, stable for surgery.  I have reviewed the patient's chart and labs.  Questions were answered to the patient's satisfaction.     Kenshawn Maciolek

## 2015-06-23 NOTE — Anesthesia Preprocedure Evaluation (Addendum)
Anesthesia Evaluation  Patient identified by MRN, date of birth, ID band  Reviewed: Allergy & Precautions, NPO status , Patient's Chart, lab work & pertinent test results  Airway Mallampati: Intubated       Dental   Pulmonary sleep apnea , pneumonia,  Hx PE. Vent dependent   breath sounds clear to auscultation       Cardiovascular negative cardio ROS   Rhythm:Regular Rate:Normal     Neuro/Psych Supranuclear palsy  Neuromuscular disease    GI/Hepatic negative GI ROS, Neg liver ROS,   Endo/Other  diabetes, Insulin Dependent  Renal/GU negative Renal ROS     Musculoskeletal   Abdominal   Peds  Hematology  (+) anemia ,   Anesthesia Other Findings   Reproductive/Obstetrics                            Anesthesia Physical Anesthesia Plan  ASA: IV  Anesthesia Plan: General   Post-op Pain Management:    Induction: Intravenous  Airway Management Planned: Oral ETT and Tracheostomy  Additional Equipment:   Intra-op Plan:   Post-operative Plan: Post-operative intubation/ventilation  Informed Consent: I have reviewed the patients History and Physical, chart, labs and discussed the procedure including the risks, benefits and alternatives for the proposed anesthesia with the patient or authorized representative who has indicated his/her understanding and acceptance.     Plan Discussed with: CRNA  Anesthesia Plan Comments:         Anesthesia Quick Evaluation

## 2015-06-23 NOTE — Transfer of Care (Signed)
Immediate Anesthesia Transfer of Care Note  Patient: Kaylee Olsen  Procedure(s) Performed: Procedure(s): TRACHEOSTOMY (N/A)  Patient Location: PACU  Anesthesia Type:General  Level of Consciousness: awake, alert , oriented and sedated  Airway & Oxygen Therapy: Patient Spontanous Breathing and Patient connected to T-piece oxygen  Post-op Assessment: Report given to RN, Post -op Vital signs reviewed and stable and Patient moving all extremities  Post vital signs: Reviewed and unstable  Last Vitals:  Filed Vitals:   06/23/15 1037  BP: 150/59  Pulse: 64  Temp: 36.7 C  Resp: 15    Complications: No apparent anesthesia complications

## 2015-06-23 NOTE — Anesthesia Postprocedure Evaluation (Signed)
  Anesthesia Post-op Note  Patient: Kaylee Olsen  Procedure(s) Performed: Procedure(s): TRACHEOSTOMY (N/A)  Patient Location: PACU  Anesthesia Type:General  Level of Consciousness: awake and sedated  Airway and Oxygen Therapy: Patient placed on Ventilator (see vital sign flow sheet for setting) and on trach  Post-op Pain: none  Post-op Assessment: Post-op Vital signs reviewed              Post-op Vital Signs: Reviewed  Last Vitals:  Filed Vitals:   06/23/15 1108  BP:   Pulse: 71  Temp:   Resp: 14    Complications: No apparent anesthesia complications

## 2015-06-23 NOTE — H&P (Signed)
PREOPERATIVE H&P  Chief Complaint: respiratory failure  HPI: Kaylee Olsen is a 69 y.o. female who presents for evaluation of tracheostomy or respiratory failure. Patient with history of PE, pneumonia, OSA and supranuclear palsy. She's been intubated on a vent for more than a week and has failed two previous extubation trials. She's taken to the OR for tracheostomy.  No past medical history on file. No past surgical history on file. Social History   Social History  . Marital Status: Married    Spouse Name: N/A  . Number of Children: N/A  . Years of Education: N/A   Social History Main Topics  . Smoking status: Not on file  . Smokeless tobacco: Not on file  . Alcohol Use: Not on file  . Drug Use: Not on file  . Sexual Activity: Not on file   Other Topics Concern  . Not on file   Social History Narrative  . No narrative on file   No family history on file. No Known Allergies Prior to Admission medications   Medication Sig Start Date End Date Taking? Authorizing Provider  acetaminophen (TYLENOL) 650 MG suppository Place 650 mg rectally every 6 (six) hours as needed for mild pain or fever.   Yes Historical Provider, MD  albuterol (PROVENTIL) (2.5 MG/3ML) 0.083% nebulizer solution Take 2.5 mg by nebulization every 6 (six) hours as needed for wheezing or shortness of breath.   Yes Historical Provider, MD  enoxaparin (LOVENOX) 60 MG/0.6ML injection Inject 52 mg into the skin every 12 (twelve) hours.   Yes Historical Provider, MD  famotidine (PEPCID) 20 MG tablet Take 20 mg by mouth 2 (two) times daily.   Yes Historical Provider, MD  insulin lispro (HUMALOG) 100 UNIT/ML injection Inject 1 Units into the skin every 6 (six) hours.   Yes Historical Provider, MD  LORazepam (ATIVAN) 2 MG/ML concentrated solution Take 1 mg by mouth every 4 (four) hours as needed for anxiety.   Yes Historical Provider, MD  morphine 2 MG/ML injection Inject 2 mg into the vein every 6 (six) hours as needed  (pain).   Yes Historical Provider, MD  nitroGLYCERIN (NITROSTAT) 0.4 MG SL tablet Place 0.4 mg under the tongue every 5 (five) minutes as needed for chest pain.   Yes Historical Provider, MD  ondansetron (ZOFRAN-ODT) 4 MG disintegrating tablet Take 4 mg by mouth every 8 (eight) hours as needed for nausea or vomiting.   Yes Historical Provider, MD  oxyCODONE (OXY IR/ROXICODONE) 5 MG immediate release tablet Take 5 mg by mouth every 6 (six) hours as needed for severe pain.   Yes Historical Provider, MD  potassium chloride 10 MEQ/50ML Inject 10 mEq into the vein as needed (low potassium).   Yes Historical Provider, MD  senna-docusate (SENOKOT-S) 8.6-50 MG tablet Take 1 tablet by mouth 2 (two) times daily as needed for mild constipation.   Yes Historical Provider, MD     Positive ROS: negative  All other systems have been reviewed and were otherwise negative with the exception of those mentioned in the HPI and as above.  Physical Exam: There were no vitals filed for this visit.  General: Intubated on vent Oral: Normal oral mucosa and tonsils Nasal: Clear nasal passages Neck: No palpable adenopathy or thyroid nodules. Tach midline. Ear: Ear canal is clear with normal appearing TMs Cardiovascular: Regular rate and rhythm, no murmur.  Respiratory: Clear to auscultation Neurologic: Awake but not responsive. Discussion with husband   Assessment/Plan: respiratory failure Plan for Procedure(s): TRACHEOSTOMY  Dillard Cannon, MD 06/23/2015 9:11 AM

## 2015-06-23 NOTE — Consult Note (Signed)
Chief Complaint: Patient was seen in consultation today for percutaneous gastric tube at the request of Dr Kaylee Olsen  Referring Physician(s): DrFortkort  History of Present Illness: Kaylee Olsen is a 69 y.o. female   Hx PE - on coumadin Now off coumadin and on Lovenox Pneumonia Acute on chronic respiratory failure Failing to wean from vent Trach placed 06/23/15 Progressive supranuclear palsy Deconditioning Request for percutaneous gastric tube placement Long term care Low grade temp x few days CT Abd pending to evaluate candidacy for procedure  No past medical history on file.  No past surgical history on file.  Allergies: Review of patient's allergies indicates no known allergies.  Medications: Prior to Admission medications   Medication Sig Start Date End Date Taking? Authorizing Provider  acetaminophen (TYLENOL) 650 MG suppository Place 650 mg rectally every 6 (six) hours as needed for mild pain or fever.   Yes Historical Provider, MD  albuterol (PROVENTIL) (2.5 MG/3ML) 0.083% nebulizer solution Take 2.5 mg by nebulization every 6 (six) hours as needed for wheezing or shortness of breath.   Yes Historical Provider, MD  enoxaparin (LOVENOX) 60 MG/0.6ML injection Inject 52 mg into the skin every 12 (twelve) hours.   Yes Historical Provider, MD  famotidine (PEPCID) 20 MG tablet Take 20 mg by mouth 2 (two) times daily.   Yes Historical Provider, MD  insulin lispro (HUMALOG) 100 UNIT/ML injection Inject 1 Units into the skin every 6 (six) hours.   Yes Historical Provider, MD  LORazepam (ATIVAN) 2 MG/ML concentrated solution Take 1 mg by mouth every 4 (four) hours as needed for anxiety.   Yes Historical Provider, MD  morphine 2 MG/ML injection Inject 2 mg into the vein every 6 (six) hours as needed (pain).   Yes Historical Provider, MD  nitroGLYCERIN (NITROSTAT) 0.4 MG SL tablet Place 0.4 mg under the tongue every 5 (five) minutes as needed for chest pain.   Yes Historical  Provider, MD  ondansetron (ZOFRAN-ODT) 4 MG disintegrating tablet Take 4 mg by mouth every 8 (eight) hours as needed for nausea or vomiting.   Yes Historical Provider, MD  oxyCODONE (OXY IR/ROXICODONE) 5 MG immediate release tablet Take 5 mg by mouth every 6 (six) hours as needed for severe pain.   Yes Historical Provider, MD  potassium chloride 10 MEQ/50ML Inject 10 mEq into the vein as needed (low potassium).   Yes Historical Provider, MD  senna-docusate (SENOKOT-S) 8.6-50 MG tablet Take 1 tablet by mouth 2 (two) times daily as needed for mild constipation.   Yes Historical Provider, MD     No family history on file.  Social History   Social History  . Marital Status: Married    Spouse Name: N/A  . Number of Children: N/A  . Years of Education: N/A   Social History Main Topics  . Smoking status: Not on file  . Smokeless tobacco: Not on file  . Alcohol Use: Not on file  . Drug Use: Not on file  . Sexual Activity: Not on file   Other Topics Concern  . Not on file   Social History Narrative  . No narrative on file     Review of Systems: A 12 point ROS discussed and pertinent positives are indicated in the HPI above.  All other systems are negative.  Review of Systems  Constitutional: Negative for fever.  Respiratory:       Vent/trach    Vital Signs: BP 128/67 mmHg  Pulse 71  Temp(Src) 97.8 F (36.6  C)  Resp 14  SpO2 100%  Physical Exam  Cardiovascular: Normal rate and regular rhythm.   Pulmonary/Chest:  Vent/trach  Abdominal: Soft.  Neurological:  Pt semi alert She is able to nod yes/no Seems appropriate  Skin: Skin is warm and dry.  Psychiatric:  Consented husband at bedside  Nursing note and vitals reviewed.   Mallampati Score:  MD Evaluation Airway: Other (comments) Airway comments: vent/trach Heart: WNL Abdomen: WNL Chest/ Lungs: WNL ASA  Classification: 3 Mallampati/Airway Score: Three  Imaging: Dg Chest Port 1 View  06/21/2015  CLINICAL  DATA:  69 year old female with a history of respiratory failure EXAM: PORTABLE CHEST 1 VIEW COMPARISON:  06/19/2015 FINDINGS: Cardiomediastinal silhouette unchanged in size and contour. Re- demonstration of endotracheal tube terminating suitably above the carina, approximately 3 cm. Gastric tube projects over the mediastinum, and terminates in the left upper quadrant. Low lung volumes with mixed linear and airspace opacities, similar to the comparison. No pneumothorax. IMPRESSION: Unchanged position of endotracheal tube, terminating suitably above the carina. Unchanged gastric tube. Similar appearance of mixed interstitial and airspace disease of the bilateral lungs. Signed, Kaylee Olsen. Kaylee Ave, DO [facility] Electronically Signed   By: Kaylee Olsen D.O.   On: 06/21/2015 15:13   Dg Chest Port 1 View  06/19/2015  CLINICAL DATA:  Respiratory failure. EXAM: PORTABLE CHEST 1 VIEW COMPARISON:  06/16/2015 . FINDINGS: Endotracheal tube and NG tube in stable position. Right PICC line stable position. Mediastinum and hilar structures are stable. Left lower lobe atelectasis and/or through noted. Interim partial clearing of right lower lobe atelectasis. Small left pleural effusion. No pneumothorax. IMPRESSION: 1. Lines and tubes in stable position. 2. Left lower lobe atelectasis and/or infiltrate with small left pleural effusion. Interim partial clearing of right lower lobe atelectasis. Electronically Signed   By: Kaylee Fus  Olsen   On: 06/19/2015 07:21   Dg Chest Port 1 View  06/16/2015  CLINICAL DATA:  Respiratory failure, clinical pneumonia, intubated patient. EXAM: PORTABLE CHEST 1 VIEW COMPARISON:  None in PACs FINDINGS: The left lung is well-expanded and clear. On the right increased airspace opacity obscures the hemidiaphragm and extends toward the right infrahilar region. The heart and pulmonary vascularity are normal. The mediastinum is normal in width.  The endotracheal tube tip earlier X 3.7 cm above the carina. The esophagogastric tube tip projects below the inferior margin of the image. The right-sided PICC line tip projects over the midportion of the SVC. IMPRESSION: Right lower lobe atelectasis or pneumonia. The support tubes are in reasonable position. Electronically Signed   By: David  [place] M.D.   On: 06/16/2015 07:18   Dg Abd Portable 1v  06/23/2015  CLINICAL DATA:  Colonic abnormality. EXAM: PORTABLE ABDOMEN - 1 VIEW COMPARISON:  06/21/2015 FINDINGS: Nasogastric tube is been removed. There is increase gaseous distention of the colon consistent with ileus. No further suggestion of over colonic wall thickening. The colon is likely very redundant. No small bowel dilatation identified with some air seen in nondilated small bowel loops. IMPRESSION: Increased gaseous distention of the colon consistent with ileus. There also likely is a component of ileus involving small bowel. No gross bowel obstruction or further suggestion of colonic wall thickening. Electronically Signed   By: Irish Lack M.D.   On: 06/23/2015 07:24   Dg Abd Portable 1v  06/21/2015  CLINICAL DATA:  Placement of orogastric tube EXAM: PORTABLE ABDOMEN - 1 VIEW COMPARISON:  None. FINDINGS: Orogastric tube tip and side port are  in the stomach. Rectal thermometer is in place. There is no appreciable bowel dilatation or air-fluid levels. Note that there is fold thickening in a portion of the transverse colon. No free air is seen on this supine examination. There is atelectasis in the left lung base. IMPRESSION: Orogastric tube tip and side port in stomach. No obstruction or free air is evident. Note that there is mild wall thickening in portions of the mid to distal transverse colon. This finding could indicate a degree of inflammation or even early ischemia. Close clinical surveillance advised. This finding may warrant contrast enhanced CT abdomen and pelvis if symptoms and wall  thickening persists radiographically. Electronically Signed   By: Bretta Bang III M.D.   On: 06/21/2015 15:13   Dg Abd Portable 1v  06/15/2015  CLINICAL DATA:  Nasogastric tube placement. EXAM: PORTABLE ABDOMEN - 1 VIEW COMPARISON:  None. FINDINGS: Normal bowel gas pattern. Nasogastric tube tip in the region of the gastric pylorus. Prominent stool in the rectum. Unremarkable bones. IMPRESSION: 1. Nasogastric tube tip in the region of the gastric pylorus. 2. Prominent stool in the rectum. Electronically Signed   By: Beckie Salts M.D.   On: 06/15/2015 17:38    Labs:  CBC:  Recent Labs  06/16/15 0528 06/19/15 0750 06/23/15 0450  WBC 6.0 4.8 6.2  HGB 8.7* 8.8* 9.2*  HCT 28.2* 28.8* 29.5*  PLT 301 324 362    COAGS:  Recent Labs  06/16/15 0528  INR 1.24    BMP:  Recent Labs  06/17/15 0732 06/18/15 0612 06/19/15 0750 06/23/15 0450  NA 134* 137 136 134*  K 3.2* 4.1 3.9 3.9  CL 100* 99* 100* 97*  CO2 GLUCOSE 85 100* 99 97  BUN CALCIUM 8.3* 9.0 9.1 9.2  CREATININE 0.54 0.55 0.47 0.54  GFRNONAA >60 >60 >60 >60  GFRAA >60 >60 >60 >60    LIVER FUNCTION TESTS:  Recent Labs  06/16/15 0528  BILITOT 0.8  AST 96*  ALT 60*  ALKPHOS 48  PROT 6.0*  ALBUMIN 3.1*    TUMOR MARKERS: No results for input(s): AFPTM, CEA, CA199, CHROMGRNA in the last 8760 hours.  Assessment and Plan:  Malnutrition Long term care Scheduled for percutaneous gastric tube placement Dependent on CT results---we are prepared to move ahead Mon 11/7 Risks and Benefits discussed with the patient's husband including, but not limited to the need for a barium enema during the procedure, bleeding, infection, peritonitis, or damage to adjacent structures. All of his questions were answered, patient is agreeable to proceed. Consent signed and in chart.   Thank you for this interesting consult.  I greatly enjoyed meeting Kaylee Olsen and look forward to participating in  their care.  A copy of this report was sent to the requesting provider on this date.  Signed: Meridith Romick A 06/23/2015, 1:06 PM   I spent a total of 40 Minutes    in face to face in clinical consultation, greater than 50% of which was counseling/coordinating care for percutaneous gastric tube placement

## 2015-06-24 ENCOUNTER — Other Ambulatory Visit (HOSPITAL_COMMUNITY): Payer: Medicare Other

## 2015-06-24 LAB — MAGNESIUM: Magnesium: 1.9 mg/dL (ref 1.7–2.4)

## 2015-06-24 LAB — COMPREHENSIVE METABOLIC PANEL
ALT: 26 U/L (ref 14–54)
ANION GAP: 9 (ref 5–15)
AST: 38 U/L (ref 15–41)
Albumin: 3.6 g/dL (ref 3.5–5.0)
Alkaline Phosphatase: 48 U/L (ref 38–126)
BUN: 10 mg/dL (ref 6–20)
CHLORIDE: 97 mmol/L — AB (ref 101–111)
CO2: 25 mmol/L (ref 22–32)
Calcium: 9.4 mg/dL (ref 8.9–10.3)
Creatinine, Ser: 0.54 mg/dL (ref 0.44–1.00)
GFR calc Af Amer: >60 mL/min (ref >60)
GFR calc non Af Amer: >60 mL/min (ref >60)
GLUCOSE: 116 mg/dL — AB (ref 65–99)
POTASSIUM: 3.9 mmol/L (ref 3.5–5.1)
Sodium: 131 mmol/L — ABNORMAL LOW (ref 135–145)
TOTAL PROTEIN: 7.4 g/dL (ref 6.5–8.1)
Total Bilirubin: 0.6 mg/dL (ref 0.3–1.2)

## 2015-06-24 LAB — CBC WITH DIFFERENTIAL/PLATELET
BASOS PCT: 0 %
Basophils Absolute: 0 10*3/uL (ref 0.0–0.1)
Eosinophils Absolute: 0 10*3/uL (ref 0.0–0.7)
Eosinophils Relative: 1 %
HEMATOCRIT: 32.1 % — AB (ref 36.0–46.0)
Hemoglobin: 10.2 g/dL — ABNORMAL LOW (ref 12.0–15.0)
Lymphocytes Relative: 17 %
Lymphs Abs: 1 10*3/uL (ref 0.7–4.0)
MCH: 27.4 pg (ref 26.0–34.0)
MCHC: 31.8 g/dL (ref 30.0–36.0)
MCV: 86.3 fL (ref 78.0–100.0)
MONO ABS: 0.6 10*3/uL (ref 0.1–1.0)
MONOS PCT: 9 %
NEUTROS ABS: 4.5 10*3/uL (ref 1.7–7.7)
Neutrophils Relative %: 73 %
Platelets: 358 10*3/uL (ref 150–400)
RBC: 3.72 MIL/uL — ABNORMAL LOW (ref 3.87–5.11)
RDW: 14.9 % (ref 11.5–15.5)
WBC: 6.1 10*3/uL (ref 4.0–10.5)

## 2015-06-24 LAB — PREALBUMIN: Prealbumin: 8 mg/dL — ABNORMAL LOW (ref 18–38)

## 2015-06-24 LAB — TRIGLYCERIDES: TRIGLYCERIDES: 124 mg/dL (ref <150)

## 2015-06-24 LAB — PHOSPHORUS: Phosphorus: 3.5 mg/dL (ref 2.5–4.6)

## 2015-06-25 LAB — BASIC METABOLIC PANEL
Anion gap: 9 (ref 5–15)
BUN: 9 mg/dL (ref 6–20)
CALCIUM: 8.9 mg/dL (ref 8.9–10.3)
CO2: 25 mmol/L (ref 22–32)
Chloride: 97 mmol/L — ABNORMAL LOW (ref 101–111)
Creatinine, Ser: 0.47 mg/dL (ref 0.44–1.00)
GFR calc Af Amer: >60 mL/min (ref >60)
GLUCOSE: 110 mg/dL — AB (ref 65–99)
Potassium: 3.8 mmol/L (ref 3.5–5.1)
Sodium: 131 mmol/L — ABNORMAL LOW (ref 135–145)

## 2015-06-26 ENCOUNTER — Encounter (HOSPITAL_COMMUNITY): Payer: Self-pay | Admitting: Otolaryngology

## 2015-06-26 ENCOUNTER — Other Ambulatory Visit (HOSPITAL_COMMUNITY): Payer: Medicare Other

## 2015-06-26 DIAGNOSIS — Z930 Tracheostomy status: Secondary | ICD-10-CM | POA: Diagnosis not present

## 2015-06-26 DIAGNOSIS — J690 Pneumonitis due to inhalation of food and vomit: Secondary | ICD-10-CM | POA: Diagnosis not present

## 2015-06-26 DIAGNOSIS — J9620 Acute and chronic respiratory failure, unspecified whether with hypoxia or hypercapnia: Secondary | ICD-10-CM | POA: Diagnosis not present

## 2015-06-26 LAB — CBC
HCT: 30.6 % — ABNORMAL LOW (ref 36.0–46.0)
HEMOGLOBIN: 9.5 g/dL — AB (ref 12.0–15.0)
MCH: 26.8 pg (ref 26.0–34.0)
MCHC: 31 g/dL (ref 30.0–36.0)
MCV: 86.2 fL (ref 78.0–100.0)
Platelets: 380 10*3/uL (ref 150–400)
RBC: 3.55 MIL/uL — AB (ref 3.87–5.11)
RDW: 14.5 % (ref 11.5–15.5)
WBC: 5.1 10*3/uL (ref 4.0–10.5)

## 2015-06-26 LAB — COMPREHENSIVE METABOLIC PANEL
ALBUMIN: 3.3 g/dL — AB (ref 3.5–5.0)
ALBUMIN: 3.4 g/dL — AB (ref 3.5–5.0)
ALK PHOS: 42 U/L (ref 38–126)
ALT: 14 U/L (ref 14–54)
ALT: 14 U/L (ref 14–54)
AST: 22 U/L (ref 15–41)
AST: 21 U/L (ref 15–41)
Alkaline Phosphatase: 41 U/L (ref 38–126)
Anion gap: 7 (ref 5–15)
Anion gap: 10 (ref 5–15)
BILIRUBIN TOTAL: 0.8 mg/dL (ref 0.3–1.2)
BUN: 10 mg/dL (ref 6–20)
BUN: 12 mg/dL (ref 6–20)
CALCIUM: 9.4 mg/dL (ref 8.9–10.3)
CO2: 28 mmol/L (ref 22–32)
CO2: 25 mmol/L (ref 22–32)
CREATININE: 0.48 mg/dL (ref 0.44–1.00)
Calcium: 9.4 mg/dL (ref 8.9–10.3)
Chloride: 99 mmol/L — ABNORMAL LOW (ref 101–111)
Chloride: 97 mmol/L — ABNORMAL LOW (ref 101–111)
Creatinine, Ser: 0.49 mg/dL (ref 0.44–1.00)
GFR calc Af Amer: >60 mL/min (ref >60)
GFR calc Af Amer: >60 mL/min (ref >60)
GFR calc non Af Amer: >60 mL/min (ref >60)
GFR calc non Af Amer: >60 mL/min (ref >60)
GLUCOSE: 123 mg/dL — AB (ref 65–99)
GLUCOSE: 114 mg/dL — AB (ref 65–99)
POTASSIUM: 3.7 mmol/L (ref 3.5–5.1)
Potassium: 3.7 mmol/L (ref 3.5–5.1)
SODIUM: 132 mmol/L — AB (ref 135–145)
Sodium: 134 mmol/L — ABNORMAL LOW (ref 135–145)
TOTAL PROTEIN: 7.4 g/dL (ref 6.5–8.1)
Total Bilirubin: 0.8 mg/dL (ref 0.3–1.2)
Total Protein: 7.1 g/dL (ref 6.5–8.1)

## 2015-06-26 LAB — APTT: aPTT: 45 seconds — ABNORMAL HIGH (ref 24–37)

## 2015-06-26 LAB — DIFFERENTIAL
BASOS ABS: 0 10*3/uL (ref 0.0–0.1)
Basophils Relative: 0 %
Eosinophils Absolute: 0 10*3/uL (ref 0.0–0.7)
Eosinophils Relative: 1 %
LYMPHS ABS: 1.3 10*3/uL (ref 0.7–4.0)
LYMPHS PCT: 25 %
Monocytes Absolute: 0.6 10*3/uL (ref 0.1–1.0)
Monocytes Relative: 13 %
NEUTROS PCT: 61 %
Neutro Abs: 3.1 10*3/uL (ref 1.7–7.7)

## 2015-06-26 LAB — PROTIME-INR
INR: 1.26 (ref 0.00–1.49)
PROTHROMBIN TIME: 16 s — AB (ref 11.6–15.2)

## 2015-06-26 LAB — TRIGLYCERIDES
Triglycerides: 47 mg/dL (ref <150)
Triglycerides: 40 mg/dL (ref <150)

## 2015-06-26 LAB — MAGNESIUM
Magnesium: 1.8 mg/dL (ref 1.7–2.4)
Magnesium: 1.8 mg/dL (ref 1.7–2.4)

## 2015-06-26 LAB — PREALBUMIN: Prealbumin: 4.9 mg/dL — ABNORMAL LOW (ref 18–38)

## 2015-06-26 LAB — PHOSPHORUS
Phosphorus: 3.8 mg/dL (ref 2.5–4.6)
Phosphorus: 3.6 mg/dL (ref 2.5–4.6)

## 2015-06-26 NOTE — Op Note (Signed)
Kaylee Olsen:  Niese, Pascale                 ACCOUNT NO.:  [id number]  MEDICAL RECORD NO.:  [id number]  LOCATION:                               FACILITY:  [facility]  PHYSICIAN:  Kristine GarbeChristopher E. Ezzard StandingNewman, M.D.DATE OF BIRTH:  01/05/1946  DATE OF PROCEDURE:  06/23/2015 DATE OF DISCHARGE:                              OPERATIVE REPORT   PREOPERATIVE DIAGNOSIS:  Ventilator-dependent respiratory failure.  POSTOPERATIVE DIAGNOSIS:  Ventilator-dependent respiratory failure.  OPERATION PERFORMED:  Tracheostomy with a #6 Shiley cuffed trachea tube.  SURGEON:  Kristine GarbeChristopher E. Ezzard StandingNewman, M.D.  ANESTHESIA:  General endotracheal.  COMPLICATIONS:  None.  BRIEF CLINICAL NOTE:  Kaylee Olsen is a 69 [facility], who has had history of a PE and pneumonia as well as suprabulbar palsy and obstructive sleep apnea.  She has been intubated for over a week, has failed two extubation attempts and is recommended that she undergo a tracheotomy.  DESCRIPTION OF PROCEDURE:  The patient was brought directly from the [facility] on the vent.  She remained in her bed.  The neck was prepped with Betadine solution.  A vertical incision was made midline overlying the trachea just below the cricoid cartilage.  Dissection was carried down through the skin and subcutaneous tissue.  The strap muscles were identified and longitudinally divided in midline.  Thyroid isthmus was identified covering the second and third tracheal rings. The thyroid isthmus was then divided with cautery and there was minimal bleeding.  A horizontal incision was made between the first and second tracheal rings and a #6 Shiley was inserted following removal of the endotracheal tube without any difficulty.  The patient was ventilated well using the new Shiley tracheostomy tube.  This was secured to the neck with 2-0 silk sutures x4 and a Velcro trach collar on.  Of note, the orogastric tube was removed from the oral cavity  and placed through the left nostril without any difficulty and passed down to the stomach and stomach contents were aspirated after placement of the nasogastric tube to the left nostril.  [facility]          ______________________________ Kristine Garbehristopher E. Ezzard StandingNewman, M.D.     CEN/MEDQ  D:  06/23/2015  T:  06/24/2015  Job:  [id number]

## 2015-06-26 NOTE — Progress Notes (Deleted)
PCCM PROGRESS NOTE  ADMISSION DATE: 06/15/2015 CONSULT DATE: 06/16/2015 REFERRING PROVIDER: Hijazi  CC: Respiratory failure  SUBJECTIVE: Pt reports feeling better.  No acute events.  Up to chair.  Family reports PEG on hold.  OBJECTIVE: Vitals: Reviewed at bedside - abnormal values discussed in A/P.   General: chronically ill appearing, NAD on vent  HEENT: #6 trach, c/d/i, sutures intact Cardiac: regular Chest: resps even no labored on vent, scattered rhonchi Abd: soft, non tender Ext: no edema Neuro: follows commands, moves extremities, nods appropriately Skin: no rashes   CMP Latest Ref Rng 06/26/2015 06/25/2015 06/24/2015  Glucose 65 - 99 mg/dL 161(W123(H) 960(A110(H) 540(J116(H)  BUN 6 - 20 mg/dL 10 9 10   Creatinine 0.44 - 1.00 mg/dL 8.110.49 9.140.47 7.820.54  Sodium 135 - 145 mmol/L 134(L) 131(L) 131(L)  Potassium 3.5 - 5.1 mmol/L 3.7 3.8 3.9  Chloride 101 - 111 mmol/L 99(L) 97(L) 97(L)  CO2 22 - 32 mmol/L 28 25 25   Calcium 8.9 - 10.3 mg/dL 9.4 8.9 9.4  Total Protein 6.5 - 8.1 g/dL 7.4 - 7.4  Total Bilirubin 0.3 - 1.2 mg/dL 0.8 - 0.6  Alkaline Phos 38 - 126 U/L 41 - 48  AST 15 - 41 U/L 22 - 38  ALT 14 - 54 U/L 14 - 26     CBC Latest Ref Rng 06/26/2015 06/24/2015 06/23/2015  WBC 4.0 - 10.5 K/uL 5.1 6.1 6.2  Hemoglobin 12.0 - 15.0 g/dL 9.5(A9.5(L) 10.2(L) 9.2(L)  Hematocrit 36.0 - 46.0 % 30.6(L) 32.1(L) 29.5(L)  Platelets 150 - 400 K/uL 380 358 362     Dg Abd Portable 1v  06/24/2015  CLINICAL DATA:  69 year old female status post nasogastric tube placement EXAM: PORTABLE ABDOMEN - 1 VIEW COMPARISON:  Prior abdominal radiograph 06/23/2015 FINDINGS: Interval placement of a nasogastric tube. The tip of the tube overlies the gastric body in good position. Persistent left lower lobe atelectasis. The bowel gas pattern is not obstructed. Gas is noted in multiple loops of nondilated small bowel. The right lung base is clear. No acute osseous abnormality. IMPRESSION: The tip of the nasogastric tube projects  over the mid gastric body in good position. Electronically Signed   By: Malachy MoanHeath  McCullough M.D.   On: 06/24/2015 10:50    STUDIES:  EVENTS: 08/23 Admit [facility] with PE, VDRF 09/21 Re-intubated 10/25 Extubated 10/27 [facility]ub[facility]  [facility]  DISCUSSION: 69 yo female with respiratory failure in setting of PE, failure to thrive, UTI, aspiration pneumonia, supranuclear palsy.  ASSESSMENT/PLAN:  Acute on chronic respiratory failure. Failure to wean from ventilator. Hx of OSA. Plan: - Trach care per protocol  - Vent wean protocol, advance toward ATC as tolerated  - f/u CXR intermittently   Aspiration pneumonia. Plan: - levaquin per primary team  Supranuclear palsy. Hx of PE. Deconditioning with failure to thrive. Plan: - per primary team. - push PT efforts    Canary BrimBrandi Aniyla Harling, NP-C [facility] Pgr: [phone] or if no answer [phone] 06/26/2015, 10:45 AM

## 2015-06-26 NOTE — Progress Notes (Signed)
PCCM PROGRESS NOTE  ADMISSION DATE: 06/15/2015 CONSULT DATE: 06/16/2015 REFERRING PROVIDER: Hijazi  CC: Respiratory failure  SUBJECTIVE: Pt reports feeling better.  No acute events.  Up to chair.  Family reports PEG on hold.  OBJECTIVE: Vitals: Reviewed at bedside - abnormal values discussed in A/P.   General: chronically ill appearing, NAD on vent  HEENT: #6 trach, c/d/i, sutures intact Cardiac: regular Chest: resps even no labored on vent, scattered rhonchi Abd: soft, non tender Ext: no edema Neuro: follows commands, moves extremities, nods appropriately Skin: no rashes   CMP Latest Ref Rng 06/26/2015 06/25/2015 06/24/2015  Glucose 65 - 99 mg/dL 696(E123(H) 952(W110(H) 413(K116(H)  BUN 6 - 20 mg/dL 10 9 10   Creatinine 0.44 - 1.00 mg/dL 4.400.49 1.020.47 7.250.54  Sodium 135 - 145 mmol/L 134(L) 131(L) 131(L)  Potassium 3.5 - 5.1 mmol/L 3.7 3.8 3.9  Chloride 101 - 111 mmol/L 99(L) 97(L) 97(L)  CO2 22 - 32 mmol/L 28 25 25   Calcium 8.9 - 10.3 mg/dL 9.4 8.9 9.4  Total Protein 6.5 - 8.1 g/dL 7.4 - 7.4  Total Bilirubin 0.3 - 1.2 mg/dL 0.8 - 0.6  Alkaline Phos 38 - 126 U/L 41 - 48  AST 15 - 41 U/L 22 - 38  ALT 14 - 54 U/L 14 - 26     CBC Latest Ref Rng 06/26/2015 06/24/2015 06/23/2015  WBC 4.0 - 10.5 K/uL 5.1 6.1 6.2  Hemoglobin 12.0 - 15.0 g/dL 3.6(U9.5(L) 10.2(L) 9.2(L)  Hematocrit 36.0 - 46.0 % 30.6(L) 32.1(L) 29.5(L)  Platelets 150 - 400 K/uL 380 358 362     No results found.  STUDIES:  EVENTS: 08/23 Admit [facility] with PE, VDRF 09/21 Re-intubated 10/25 Extubated 10/27 [facility]  [facility]  I reviewed CXR myself, trach in good position.  DISCUSSION: 69 yo female with respiratory failure in setting of PE, failure to thrive, UTI, aspiration pneumonia, supranuclear palsy.  ASSESSMENT/PLAN:  Acute on chronic respiratory failure. Failure to wean from ventilator. Hx of OSA. Plan: - Trach care per protocol  - Vent wean protocol, advance toward ATC as tolerated hopefully today. - f/u CXR  intermittently  Aspiration pneumonia. Plan: - Levaquin per primary team  Supranuclear palsy. Hx of PE. Deconditioning with failure to thrive. Plan: - Nutrition support. - Push PT efforts   Discussed with Dr. Annabelle HarmanHigazi.  Alyson ReedyWesam G. Yacoub, M.D. [facility]. Pager: [phone]. After hours pager: [phone].  06/26/2015, 11:46 AM

## 2015-06-27 ENCOUNTER — Ambulatory Visit (HOSPITAL_COMMUNITY): Payer: Medicare Other | Attending: Interventional Radiology

## 2015-06-27 DIAGNOSIS — Z931 Gastrostomy status: Secondary | ICD-10-CM | POA: Diagnosis not present

## 2015-06-27 DIAGNOSIS — K8020 Calculus of gallbladder without cholecystitis without obstruction: Secondary | ICD-10-CM | POA: Diagnosis not present

## 2015-06-27 LAB — BASIC METABOLIC PANEL
Anion gap: 8 (ref 5–15)
BUN: 11 mg/dL (ref 6–20)
CO2: 28 mmol/L (ref 22–32)
Calcium: 9.2 mg/dL (ref 8.9–10.3)
Chloride: 97 mmol/L — ABNORMAL LOW (ref 101–111)
Creatinine, Ser: 0.43 mg/dL — ABNORMAL LOW (ref 0.44–1.00)
GFR calc Af Amer: >60 mL/min (ref >60)
GFR calc non Af Amer: >60 mL/min (ref >60)
Glucose, Bld: 136 mg/dL — ABNORMAL HIGH (ref 65–99)
Potassium: 3.5 mmol/L (ref 3.5–5.1)
Sodium: 133 mmol/L — ABNORMAL LOW (ref 135–145)

## 2015-06-27 NOTE — Progress Notes (Signed)
   Request was made for percutaneous gastric tube placement  CT prior to procedure reveals LARGE ileus---colon overlying stomach  Discussed with Dr Fredia SorrowYamagata He feels this is unfavorable anatomy for perc G tube  Rec:  Decompression of ileus Could revisit candidacy at later date if MD feels appropriate  Dr Sharyon MedicusHijazi aware and agreeable

## 2015-06-28 ENCOUNTER — Other Ambulatory Visit (HOSPITAL_COMMUNITY): Payer: Medicare Other

## 2015-06-28 DIAGNOSIS — I2609 Other pulmonary embolism with acute cor pulmonale: Secondary | ICD-10-CM | POA: Diagnosis not present

## 2015-06-28 DIAGNOSIS — J690 Pneumonitis due to inhalation of food and vomit: Secondary | ICD-10-CM | POA: Diagnosis not present

## 2015-06-28 DIAGNOSIS — Z930 Tracheostomy status: Secondary | ICD-10-CM | POA: Diagnosis not present

## 2015-06-28 DIAGNOSIS — G4733 Obstructive sleep apnea (adult) (pediatric): Secondary | ICD-10-CM | POA: Diagnosis not present

## 2015-06-28 LAB — BASIC METABOLIC PANEL
ANION GAP: 7 (ref 5–15)
ANION GAP: 9 (ref 5–15)
BUN: 11 mg/dL (ref 6–20)
BUN: 11 mg/dL (ref 6–20)
CHLORIDE: 97 mmol/L — AB (ref 101–111)
CHLORIDE: 97 mmol/L — AB (ref 101–111)
CO2: 29 mmol/L (ref 22–32)
CO2: 28 mmol/L (ref 22–32)
Calcium: 9.3 mg/dL (ref 8.9–10.3)
Calcium: 9.2 mg/dL (ref 8.9–10.3)
Creatinine, Ser: 0.47 mg/dL (ref 0.44–1.00)
Creatinine, Ser: 0.34 mg/dL — ABNORMAL LOW (ref 0.44–1.00)
GFR calc Af Amer: >60 mL/min (ref >60)
GFR calc Af Amer: >60 mL/min (ref >60)
GLUCOSE: 140 mg/dL — AB (ref 65–99)
Glucose, Bld: 131 mg/dL — ABNORMAL HIGH (ref 65–99)
POTASSIUM: 3.7 mmol/L (ref 3.5–5.1)
POTASSIUM: 3.6 mmol/L (ref 3.5–5.1)
SODIUM: 133 mmol/L — AB (ref 135–145)
SODIUM: 134 mmol/L — AB (ref 135–145)

## 2015-06-28 NOTE — Progress Notes (Deleted)
PCCM PROGRESS NOTE  ADMISSION DATE: 06/15/2015 CONSULT DATE: 06/16/2015 REFERRING PROVIDER: Hijazi  CC: Respiratory failure  SUBJECTIVE: On PS , nods, in restarints  OBJECTIVE: Vitals: Vital signs reviewed. Abnormal values will appear under impression plan section.    General: chronically ill appearing, NAD on vent in restraints HEENT: #6 trach, c/d/i Cardiac: regular Chest: resps even no labored on vent, decreased bs Abd: soft, non tender. NGT to suction for ileus Ext: no edema Neuro: follows commands, moves extremities, nods appropriately Skin: no rashes   CMP Latest Ref Rng 06/28/2015 06/27/2015 06/26/2015  Glucose 65 - 99 mg/dL 621(H) 086(V) 784(O)  BUN 6 - 20 mg/dL Creatinine 0.44 - 1.00 mg/dL 9.62 9.52(W) 4.13  Sodium 135 - 145 mmol/L 133(L) 133(L) 132(L)  Potassium 3.5 - 5.1 mmol/L 3.7 3.5 3.7  Chloride 101 - 111 mmol/L 97(L) 97(L) 97(L)  CO2 22 - 32 mmol/L Calcium 8.9 - 10.3 mg/dL 9.3 9.2 9.4  Total Protein 6.5 - 8.1 g/dL - - 7.1  Total Bilirubin 0.3 - 1.2 mg/dL - - 0.8  Alkaline Phos 38 - 126 U/L - - 42  AST 15 - 41 U/L - - 21  ALT 14 - 54 U/L - - 14     CBC Latest Ref Rng 06/26/2015 06/24/2015 06/23/2015  WBC 4.0 - 10.5 K/uL 5.1 6.1 6.2  Hemoglobin 12.0 - 15.0 g/dL 2.4(M) 10.2(L) 9.2(L)  Hematocrit 36.0 - 46.0 % 30.6(L) 32.1(L) 29.5(L)  Platelets 150 - 400 K/uL 380 358 362     Ct Abdomen Wo Contrast  06/27/2015  CLINICAL DATA:  Pre gastrostomy tube placement. EXAM: CT ABDOMEN WITHOUT CONTRAST TECHNIQUE: Multidetector CT imaging of the abdomen was performed following the standard protocol without IV contrast. COMPARISON:  Plain films 06/26/2015 FINDINGS: The stomach is decompressed. Gaseous distention of the colon. The transverse colon over lie is the stomach. Marked gaseous distention of the rectosigmoid colon. Multiple layering gallstones within the gallbladder. Liver, spleen, pancreas, adrenals, kidneys are unremarkable. Mild gaseous  distention of small bowel without evidence of obstruction. No free fluid, free air or adenopathy. New at calcifications throughout the aorta without aneurysm. Foley catheter is in place within the bladder which is decompressed. No acute bony abnormality or focal bone lesion. IMPRESSION: Gaseous distention of the colon, including the transverse colon which overlies the decompressed stomach. Cholelithiasis. Electronically Signed   By: Charlett Nose M.D.   On: 06/27/2015 11:52   Dg Abd Portable 1v  06/26/2015  CLINICAL DATA:  Abdominal discomfort. Evaluate for possible adynamic ileus. EXAM: PORTABLE ABDOMEN - 1 VIEW COMPARISON:  06/24/2015. FINDINGS: There is increased bowel gas diffusely, but no significant bowel dilation. No evidence of obstruction. Findings could be due to a mild adynamic ileus. Nasogastric tube is well positioned with its tip well within the stomach. Soft tissues are unremarkable. There are streaky opacity at the left lung base consistent with atelectasis, stable. IMPRESSION: 1. No evidence of bowel obstruction. 2. Generalized increased bowel gas without significant bowel dilation. This may reflect a mild adynamic ileus. 3. Nasogastric tube is well positioned. Electronically Signed   By: Amie Portland M.D.   On: 06/26/2015 16:58    STUDIES:  EVENTS: 08/23 Admit [facility] with PE, VDRF 09/21 Re-intubated 10/25 Extubated 10/27 Re-intubated  10/28 To [facility]  Ct Abdomen Wo Contrast  06/27/2015  CLINICAL DATA:  Pre gastrostomy tube placement. EXAM: CT ABDOMEN WITHOUT CONTRAST TECHNIQUE: Multidetector CT imaging of the abdomen was performed following the standard  protocol without IV contrast. COMPARISON:  Plain films 06/26/2015 FINDINGS: The stomach is decompressed. Gaseous distention of the colon. The transverse colon over lie is the stomach. Marked gaseous distention of the rectosigmoid colon. Multiple layering gallstones within the gallbladder. Liver, spleen, pancreas, adrenals, kidneys are  unremarkable. Mild gaseous distention of small bowel without evidence of obstruction. No free fluid, free air or adenopathy. New at calcifications throughout the aorta without aneurysm. Foley catheter is in place within the bladder which is decompressed. No acute bony abnormality or focal bone lesion. IMPRESSION: Gaseous distention of the colon, including the transverse colon which overlies the decompressed stomach. Cholelithiasis. Electronically Signed   By: Charlett NoseKevin  Dover M.D.   On: 06/27/2015 11:52   Dg Abd Portable 1v  06/26/2015  CLINICAL DATA:  Abdominal discomfort. Evaluate for possible adynamic ileus. EXAM: PORTABLE ABDOMEN - 1 VIEW COMPARISON:  06/24/2015. FINDINGS: There is increased bowel gas diffusely, but no significant bowel dilation. No evidence of obstruction. Findings could be due to a mild adynamic ileus. Nasogastric tube is well positioned with its tip well within the stomach. Soft tissues are unremarkable. There are streaky opacity at the left lung base consistent with atelectasis, stable. IMPRESSION: 1. No evidence of bowel obstruction. 2. Generalized increased bowel gas without significant bowel dilation. This may reflect a mild adynamic ileus. 3. Nasogastric tube is well positioned. Electronically Signed   By: Amie Portlandavid  Ormond M.D.   On: 06/26/2015 16:58     DISCUSSION: 69 yo female with respiratory failure in setting of PE, failure to thrive, UTI, aspiration pneumonia, supranuclear palsy.  ASSESSMENT/PLAN:  Acute on chronic respiratory failure. Failure to wean from ventilator. Hx of OSA. Plan: - Trach care per protocol  - Vent wean protocol, advance toward ATC as tolerated currently on ps 14 - f/u CXR intermittently  Aspiration pneumonia. Plan: - Levaquin per primary team  Supranuclear palsy. Hx of PE. Deconditioning with failure to thrive. Plan: - Nutrition support. - Push PT efforts   Ileus  plan -NGT -hold on peg  [facility] Sherene Plancarte ACNP [facility] Pager  [phone] till 3 pm If no answer page [phone] 06/28/2015, 10:29 AM

## 2015-06-28 NOTE — Progress Notes (Signed)
PCCM PROGRESS NOTE  ADMISSION DATE: 06/15/2015 CONSULT DATE: 06/16/2015 REFERRING PROVIDER: Hijazi  CC: Respiratory failure  SUBJECTIVE: Pt reports feeling better.  No acute events.  Up to chair.  Family reports PEG on hold.  OBJECTIVE: Vitals: Reviewed at bedside - abnormal values discussed in A/P.   General: chronically ill appearing, NAD on vent  HEENT: #6 trach, c/d/i, sutures intact Cardiac: regular Chest: resps even no labored on vent, scattered rhonchi Abd: soft, non tender Ext: no edema Neuro: follows commands, moves extremities, nods appropriately Skin: no rashes   CMP Latest Ref Rng 06/28/2015 06/27/2015 06/26/2015  Glucose 65 - 99 mg/dL 161(W) 960(A) 540(J)  BUN 6 - 20 mg/dL Creatinine 0.44 - 1.00 mg/dL 8.11 9.14(N) 8.29  Sodium 135 - 145 mmol/L 133(L) 133(L) 132(L)  Potassium 3.5 - 5.1 mmol/L 3.7 3.5 3.7  Chloride 101 - 111 mmol/L 97(L) 97(L) 97(L)  CO2 22 - 32 mmol/L Calcium 8.9 - 10.3 mg/dL 9.3 9.2 9.4  Total Protein 6.5 - 8.1 g/dL - - 7.1  Total Bilirubin 0.3 - 1.2 mg/dL - - 0.8  Alkaline Phos 38 - 126 U/L - - 42  AST 15 - 41 U/L - - 21  ALT 14 - 54 U/L - - 14     CBC Latest Ref Rng 06/26/2015 06/24/2015 06/23/2015  WBC 4.0 - 10.5 K/uL 5.1 6.1 6.2  Hemoglobin 12.0 - 15.0 g/dL 5.6(O) 10.2(L) 9.2(L)  Hematocrit 36.0 - 46.0 % 30.6(L) 32.1(L) 29.5(L)  Platelets 150 - 400 K/uL 380 358 362     Ct Abdomen Wo Contrast  06/27/2015  CLINICAL DATA:  Pre gastrostomy tube placement. EXAM: CT ABDOMEN WITHOUT CONTRAST TECHNIQUE: Multidetector CT imaging of the abdomen was performed following the standard protocol without IV contrast. COMPARISON:  Plain films 06/26/2015 FINDINGS: The stomach is decompressed. Gaseous distention of the colon. The transverse colon over lie is the stomach. Marked gaseous distention of the rectosigmoid colon. Multiple layering gallstones within the gallbladder. Liver, spleen, pancreas, adrenals, kidneys are unremarkable.  Mild gaseous distention of small bowel without evidence of obstruction. No free fluid, free air or adenopathy. New at calcifications throughout the aorta without aneurysm. Foley catheter is in place within the bladder which is decompressed. No acute bony abnormality or focal bone lesion. IMPRESSION: Gaseous distention of the colon, including the transverse colon which overlies the decompressed stomach. Cholelithiasis. Electronically Signed   By: Charlett Nose M.D.   On: 06/27/2015 11:52   Dg Abd Portable 1v  06/26/2015  CLINICAL DATA:  Abdominal discomfort. Evaluate for possible adynamic ileus. EXAM: PORTABLE ABDOMEN - 1 VIEW COMPARISON:  06/24/2015. FINDINGS: There is increased bowel gas diffusely, but no significant bowel dilation. No evidence of obstruction. Findings could be due to a mild adynamic ileus. Nasogastric tube is well positioned with its tip well within the stomach. Soft tissues are unremarkable. There are streaky opacity at the left lung base consistent with atelectasis, stable. IMPRESSION: 1. No evidence of bowel obstruction. 2. Generalized increased bowel gas without significant bowel dilation. This may reflect a mild adynamic ileus. 3. Nasogastric tube is well positioned. Electronically Signed   By: Amie Portland M.D.   On: 06/26/2015 16:58    STUDIES:  EVENTS: 08/23 Admit [facility] with PE, VDRF 09/21 Re-intubated 10/25 Extubated 10/27 Re-intubated  10/28 To [facility]  I reviewed CXR myself, trach in good position.  DISCUSSION: 69 yo female with respiratory failure in setting of PE, failure to thrive, UTI, aspiration pneumonia,  supranuclear palsy.  ASSESSMENT/PLAN:  Acute on chronic respiratory failure. Failure to wean from ventilator. Hx of OSA. Plan: - Trach care per protocol. - Vent wean protocol, advance toward ATC as tolerated currently on ps 14. - F/u CXR intermittently.  Aspiration pneumonia. Plan: - Levaquin per primary team  Supranuclear palsy. Hx of  PE. Deconditioning with failure to thrive. Plan: - Nutrition support. - Push PT efforts   Discussed with Dr. Annabelle HarmanHigazi.  Alyson ReedyWesam G. Koa Zoeller, M.D. [facility]. Pager: [phone]. After hours pager: [phone].  06/28/2015, 11:48 AM

## 2015-06-29 LAB — MAGNESIUM: MAGNESIUM: 1.7 mg/dL (ref 1.7–2.4)

## 2015-06-29 LAB — PHOSPHORUS: Phosphorus: 3.3 mg/dL (ref 2.5–4.6)

## 2015-06-30 LAB — BASIC METABOLIC PANEL
ANION GAP: 11 (ref 5–15)
BUN: 11 mg/dL (ref 6–20)
CALCIUM: 9.5 mg/dL (ref 8.9–10.3)
CO2: 30 mmol/L (ref 22–32)
Chloride: 97 mmol/L — ABNORMAL LOW (ref 101–111)
Creatinine, Ser: 0.44 mg/dL (ref 0.44–1.00)
GFR calc Af Amer: >60 mL/min (ref >60)
Glucose, Bld: 135 mg/dL — ABNORMAL HIGH (ref 65–99)
POTASSIUM: 3.6 mmol/L (ref 3.5–5.1)
SODIUM: 138 mmol/L (ref 135–145)

## 2015-07-01 ENCOUNTER — Other Ambulatory Visit (HOSPITAL_COMMUNITY): Payer: Medicare Other

## 2015-07-02 LAB — RENAL FUNCTION PANEL
ALBUMIN: 3 g/dL — AB (ref 3.5–5.0)
ANION GAP: 9 (ref 5–15)
BUN: 12 mg/dL (ref 6–20)
CO2: 32 mmol/L (ref 22–32)
Calcium: 9.3 mg/dL (ref 8.9–10.3)
Chloride: 96 mmol/L — ABNORMAL LOW (ref 101–111)
Creatinine, Ser: 0.5 mg/dL (ref 0.44–1.00)
GFR calc Af Amer: >60 mL/min (ref >60)
GFR calc non Af Amer: >60 mL/min (ref >60)
GLUCOSE: 120 mg/dL — AB (ref 65–99)
PHOSPHORUS: 3.8 mg/dL (ref 2.5–4.6)
POTASSIUM: 3.7 mmol/L (ref 3.5–5.1)
Sodium: 137 mmol/L (ref 135–145)

## 2015-07-02 LAB — CBC
HEMATOCRIT: 28.6 % — AB (ref 36.0–46.0)
HEMOGLOBIN: 8.7 g/dL — AB (ref 12.0–15.0)
MCH: 26.7 pg (ref 26.0–34.0)
MCHC: 30.4 g/dL (ref 30.0–36.0)
MCV: 87.7 fL (ref 78.0–100.0)
Platelets: 322 10*3/uL (ref 150–400)
RBC: 3.26 MIL/uL — ABNORMAL LOW (ref 3.87–5.11)
RDW: 13.8 % (ref 11.5–15.5)
WBC: 3.9 10*3/uL — ABNORMAL LOW (ref 4.0–10.5)

## 2015-07-03 DIAGNOSIS — J9601 Acute respiratory failure with hypoxia: Secondary | ICD-10-CM | POA: Diagnosis not present

## 2015-07-03 DIAGNOSIS — Z930 Tracheostomy status: Secondary | ICD-10-CM | POA: Diagnosis not present

## 2015-07-03 LAB — COMPREHENSIVE METABOLIC PANEL
ALBUMIN: 3 g/dL — AB (ref 3.5–5.0)
ALT: 76 U/L — ABNORMAL HIGH (ref 14–54)
ANION GAP: 7 (ref 5–15)
AST: 72 U/L — ABNORMAL HIGH (ref 15–41)
Alkaline Phosphatase: 58 U/L (ref 38–126)
BUN: 13 mg/dL (ref 6–20)
CALCIUM: 9.5 mg/dL (ref 8.9–10.3)
CHLORIDE: 98 mmol/L — AB (ref 101–111)
CO2: 32 mmol/L (ref 22–32)
Creatinine, Ser: 0.52 mg/dL (ref 0.44–1.00)
GFR calc non Af Amer: >60 mL/min (ref >60)
GLUCOSE: 113 mg/dL — AB (ref 65–99)
POTASSIUM: 3.7 mmol/L (ref 3.5–5.1)
SODIUM: 137 mmol/L (ref 135–145)
Total Bilirubin: 0.4 mg/dL (ref 0.3–1.2)
Total Protein: 6.4 g/dL — ABNORMAL LOW (ref 6.5–8.1)

## 2015-07-03 LAB — DIFFERENTIAL
BASOS ABS: 0 10*3/uL (ref 0.0–0.1)
BASOS PCT: 1 %
Eosinophils Absolute: 0.1 10*3/uL (ref 0.0–0.7)
Eosinophils Relative: 2 %
Lymphocytes Relative: 29 %
Lymphs Abs: 1.1 10*3/uL (ref 0.7–4.0)
MONO ABS: 0.7 10*3/uL (ref 0.1–1.0)
Monocytes Relative: 18 %
NEUTROS ABS: 1.8 10*3/uL (ref 1.7–7.7)
NEUTROS PCT: 50 %

## 2015-07-03 LAB — PHOSPHORUS: PHOSPHORUS: 4 mg/dL (ref 2.5–4.6)

## 2015-07-03 LAB — PREALBUMIN: Prealbumin: 10.7 mg/dL — ABNORMAL LOW (ref 18–38)

## 2015-07-03 LAB — MAGNESIUM
MAGNESIUM: 1.9 mg/dL (ref 1.7–2.4)
MAGNESIUM: 1.8 mg/dL (ref 1.7–2.4)

## 2015-07-03 LAB — TRIGLYCERIDES
TRIGLYCERIDES: 33 mg/dL (ref <150)
TRIGLYCERIDES: 40 mg/dL (ref <150)

## 2015-07-03 NOTE — Progress Notes (Signed)
PCCM PROGRESS NOTE  ADMISSION DATE: 06/15/2015 CONSULT DATE: 06/16/2015 REFERRING PROVIDER: Hijazi  CC: Respiratory failure  SUBJECTIVE: Tolerated 6 hours of trach collar  No acute events.   OBJECTIVE: Vitals: Reviewed at bedside - abnormal values discussed in A/P.   General: chronically ill appearing, NAD on ATC  HEENT: #6 trach, c/d/i, sutures intact Cardiac: regular Chest: resps even no labored on vent, scattered rhonchi Abd: soft, non tender Ext: no edema Neuro: follows commands, moves extremities, nods appropriately Skin: no rashes   CMP Latest Ref Rng 07/03/2015 07/02/2015 06/30/2015  Glucose 65 - 99 mg/dL 161(W113(H) 960(A120(H) 540(J135(H)  BUN 6 - 20 mg/dL 13 12 11   Creatinine 0.44 - 1.00 mg/dL 8.110.52 9.140.50 7.820.44  Sodium 135 - 145 mmol/L 137 137 138  Potassium 3.5 - 5.1 mmol/L 3.7 3.7 3.6  Chloride 101 - 111 mmol/L 98(L) 96(L) 97(L)  CO2 22 - 32 mmol/L 32 32 30  Calcium 8.9 - 10.3 mg/dL 9.5 9.3 9.5  Total Protein 6.5 - 8.1 g/dL 6.4(L) - -  Total Bilirubin 0.3 - 1.2 mg/dL 0.4 - -  Alkaline Phos 38 - 126 U/L 58 - -  AST 15 - 41 U/L 72(H) - -  ALT 14 - 54 U/L 76(H) - -     CBC Latest Ref Rng 07/02/2015 06/26/2015 06/24/2015  WBC 4.0 - 10.5 K/uL 3.9(L) 5.1 6.1  Hemoglobin 12.0 - 15.0 g/dL 9.5(A8.7(L) 2.1(H9.5(L) 10.2(L)  Hematocrit 36.0 - 46.0 % 28.6(L) 30.6(L) 32.1(L)  Platelets 150 - 400 K/uL 322 380 358     No results found.  STUDIES:  EVENTS: 08/23 Admit [facility] with PE, VDRF 09/21 Re-intubated 10/25 Extubated 10/27 [facility]  [facility]  I reviewed CXR myself, trach in good position.  DISCUSSION: 69 yo female with respiratory failure in setting of PE, failure to thrive, UTI, aspiration pneumonia, supranuclear palsy.  ASSESSMENT/PLAN:  Acute on chronic respiratory failure. Failure to wean from ventilator. Hx of OSA. Plan: - ATC - fast track  per protocol. - F/u CXR intermittently.  Aspiration pneumonia. Plan: - Levaquin per primary team  Supranuclear  palsy. Hx of PE. Deconditioning with failure to thrive. Plan: - Nutrition support. - Push PT efforts   Discussed on multidisciplinary rounds with select team  [facility],Lazara Grieser V. MD [phone]  07/03/2015, 11:46 AM

## 2015-07-03 NOTE — Progress Notes (Signed)
Error/duplicate   Kaylee Olsen E Redford Behrle ACNP-BC [place] [facility]/Critical Care Pager # [phone] OR # [phone] if no answer     

## 2015-07-04 NOTE — Progress Notes (Signed)
IR received request to reassess for poss G-tube placement. Previously not a candidate due to dilated bowel overlying access to stomach on CT. Newer plain films suggest bowel dilatation is improved.  Reviewed with Dr. Grace IsaacWatts, recommend repeat non contrasted CT of the abdomen to re-evaluate anatomy for potential G-tube placement. Have ordered CT  Brayton ElKevin Sakiyah Shur PA-C Interventional Radiology 07/04/2015 1:28 PM

## 2015-07-05 ENCOUNTER — Other Ambulatory Visit (HOSPITAL_COMMUNITY): Payer: Medicare Other

## 2015-07-05 DIAGNOSIS — J9600 Acute respiratory failure, unspecified whether with hypoxia or hypercapnia: Secondary | ICD-10-CM

## 2015-07-05 DIAGNOSIS — Z930 Tracheostomy status: Secondary | ICD-10-CM

## 2015-07-05 LAB — BASIC METABOLIC PANEL
ANION GAP: 7 (ref 5–15)
ANION GAP: 6 (ref 5–15)
BUN: 16 mg/dL (ref 6–20)
BUN: 15 mg/dL (ref 6–20)
CALCIUM: 9.2 mg/dL (ref 8.9–10.3)
CALCIUM: 9.4 mg/dL (ref 8.9–10.3)
CO2: 29 mmol/L (ref 22–32)
CO2: 30 mmol/L (ref 22–32)
Chloride: 99 mmol/L — ABNORMAL LOW (ref 101–111)
Chloride: 100 mmol/L — ABNORMAL LOW (ref 101–111)
Creatinine, Ser: 0.55 mg/dL (ref 0.44–1.00)
Creatinine, Ser: 0.54 mg/dL (ref 0.44–1.00)
GLUCOSE: 112 mg/dL — AB (ref 65–99)
Glucose, Bld: 118 mg/dL — ABNORMAL HIGH (ref 65–99)
Potassium: 4.2 mmol/L (ref 3.5–5.1)
Potassium: 4 mmol/L (ref 3.5–5.1)
SODIUM: 135 mmol/L (ref 135–145)
SODIUM: 136 mmol/L (ref 135–145)

## 2015-07-05 NOTE — Progress Notes (Signed)
PCCM PROGRESS NOTE  ADMISSION DATE: 06/15/2015 CONSULT DATE: 06/16/2015 REFERRING PROVIDER: Hijazi  CC: Respiratory failure  SUBJECTIVE: Tolerated 8 hours of trach collar  No acute events.   OBJECTIVE: Vitals: Vital signs reviewed. Abnormal values will appear under impression plan section.    General: chronically ill appearing, NAD on full vent support HEENT: #6 trach, c/d/i Cardiac: regular Chest: resps even not labored on vent, scattered rhonchi Abd: soft, non tender Ext: no edema Neuro: follows commands, moves extremities, nods appropriately Skin: no rashes   CMP Latest Ref Rng 07/05/2015 07/03/2015 07/02/2015  Glucose 65 - 99 mg/dL 098(J118(H) 191(Y113(H) 782(N120(H)  BUN 6 - 20 mg/dL 16 13 12   Creatinine 0.44 - 1.00 mg/dL 5.620.55 1.300.52 8.650.50  Sodium 135 - 145 mmol/L 135 137 137  Potassium 3.5 - 5.1 mmol/L 4.2 3.7 3.7  Chloride 101 - 111 mmol/L 99(L) 98(L) 96(L)  CO2 22 - 32 mmol/L 29 32 32  Calcium 8.9 - 10.3 mg/dL 9.2 9.5 9.3  Total Protein 6.5 - 8.1 g/dL - 6.4(L) -  Total Bilirubin 0.3 - 1.2 mg/dL - 0.4 -  Alkaline Phos 38 - 126 U/L - 58 -  AST 15 - 41 U/L - 72(H) -  ALT 14 - 54 U/L - 76(H) -     CBC Latest Ref Rng 07/02/2015 06/26/2015 06/24/2015  WBC 4.0 - 10.5 K/uL 3.9(L) 5.1 6.1  Hemoglobin 12.0 - 15.0 g/dL 7.8(I8.7(L) 6.9(G9.5(L) 10.2(L)  Hematocrit 36.0 - 46.0 % 28.6(L) 30.6(L) 32.1(L)  Platelets 150 - 400 K/uL 322 380 358     No results found.  STUDIES:  EVENTS: 08/23 Admit [facility] with PE, VDRF 09/21 Re-intubated 10/25 Extubated 10/27 Re-intubated  10[facility]  No results found.   DISCUSSION: 69 yo female with respiratory failure in setting of PE, failure to thrive, UTI, aspiration pneumonia, supranuclear palsy.  ASSESSMENT/PLAN:  Acute on chronic respiratory failure. Failure to wean from ventilator. Hx of OSA. Plan: - ATC - fast track  per protocol. - F/u CXR intermittently.  Aspiration pneumonia. Plan: - Abx per primary team  Supranuclear palsy. Hx of  PE. Deconditioning with failure to thrive. Plan: - Nutrition support. - Push PT efforts   Brett CanalesSteve Minor ACNP [facility] Pager [phone] till 3 pm If no answer page [phone] 07/05/2015, 10:53 AM

## 2015-07-06 LAB — MAGNESIUM: Magnesium: 2 mg/dL (ref 1.7–2.4)

## 2015-07-06 LAB — PHOSPHORUS: Phosphorus: 3.5 mg/dL (ref 2.5–4.6)

## 2015-07-06 NOTE — Progress Notes (Signed)
CT imaging reviewed with Dr. Miles CostainShick today and the patient is not a candidate for percutaneous approach given colon anterior to stomach and large hernia.   Pattricia BossKoreen Shirlene Andaya PA-C Interventional Radiology  07/06/15  9:47 AM

## 2015-07-07 LAB — CBC
HEMATOCRIT: 30.2 % — AB (ref 36.0–46.0)
Hemoglobin: 9.6 g/dL — ABNORMAL LOW (ref 12.0–15.0)
MCH: 27.4 pg (ref 26.0–34.0)
MCHC: 31.8 g/dL (ref 30.0–36.0)
MCV: 86.3 fL (ref 78.0–100.0)
PLATELETS: 270 10*3/uL (ref 150–400)
RBC: 3.5 MIL/uL — ABNORMAL LOW (ref 3.87–5.11)
RDW: 14 % (ref 11.5–15.5)
WBC: 5.4 10*3/uL (ref 4.0–10.5)

## 2015-07-07 LAB — BASIC METABOLIC PANEL
Anion gap: 7 (ref 5–15)
BUN: 17 mg/dL (ref 6–20)
CALCIUM: 9.5 mg/dL (ref 8.9–10.3)
CO2: 31 mmol/L (ref 22–32)
CREATININE: 0.68 mg/dL (ref 0.44–1.00)
Chloride: 99 mmol/L — ABNORMAL LOW (ref 101–111)
GFR calc Af Amer: >60 mL/min (ref >60)
GLUCOSE: 104 mg/dL — AB (ref 65–99)
Potassium: 4 mmol/L (ref 3.5–5.1)
SODIUM: 137 mmol/L (ref 135–145)

## 2015-07-10 ENCOUNTER — Other Ambulatory Visit (HOSPITAL_COMMUNITY): Payer: Medicare Other

## 2015-07-10 DIAGNOSIS — J690 Pneumonitis due to inhalation of food and vomit: Secondary | ICD-10-CM | POA: Diagnosis not present

## 2015-07-10 DIAGNOSIS — I2609 Other pulmonary embolism with acute cor pulmonale: Secondary | ICD-10-CM | POA: Diagnosis not present

## 2015-07-10 DIAGNOSIS — R627 Adult failure to thrive: Secondary | ICD-10-CM | POA: Diagnosis not present

## 2015-07-10 DIAGNOSIS — G4733 Obstructive sleep apnea (adult) (pediatric): Secondary | ICD-10-CM | POA: Diagnosis not present

## 2015-07-10 LAB — COMPREHENSIVE METABOLIC PANEL
ALK PHOS: 58 U/L (ref 38–126)
ALT: 36 U/L (ref 14–54)
ANION GAP: 9 (ref 5–15)
AST: 32 U/L (ref 15–41)
Albumin: 3.5 g/dL (ref 3.5–5.0)
BUN: 15 mg/dL (ref 6–20)
CO2: 31 mmol/L (ref 22–32)
Calcium: 9.4 mg/dL (ref 8.9–10.3)
Chloride: 95 mmol/L — ABNORMAL LOW (ref 101–111)
Creatinine, Ser: 0.59 mg/dL (ref 0.44–1.00)
GFR calc non Af Amer: >60 mL/min (ref >60)
Glucose, Bld: 109 mg/dL — ABNORMAL HIGH (ref 65–99)
Potassium: 3.9 mmol/L (ref 3.5–5.1)
SODIUM: 135 mmol/L (ref 135–145)
TOTAL PROTEIN: 7.4 g/dL (ref 6.5–8.1)
Total Bilirubin: 0.5 mg/dL (ref 0.3–1.2)

## 2015-07-10 LAB — PHOSPHORUS: Phosphorus: 3.9 mg/dL (ref 2.5–4.6)

## 2015-07-10 LAB — DIFFERENTIAL
Basophils Absolute: 0 K/uL (ref 0.0–0.1)
Basophils Relative: 0 %
Eosinophils Absolute: 0.1 K/uL (ref 0.0–0.7)
Eosinophils Relative: 1 %
Lymphocytes Relative: 23 %
Lymphs Abs: 1.4 K/uL (ref 0.7–4.0)
Monocytes Absolute: 0.6 K/uL (ref 0.1–1.0)
Monocytes Relative: 10 %
Neutro Abs: 4 K/uL (ref 1.7–7.7)
Neutrophils Relative %: 66 %

## 2015-07-10 LAB — MAGNESIUM: Magnesium: 1.8 mg/dL (ref 1.7–2.4)

## 2015-07-10 LAB — PREALBUMIN: PREALBUMIN: 9.2 mg/dL — AB (ref 18–38)

## 2015-07-10 LAB — TRIGLYCERIDES: TRIGLYCERIDES: 45 mg/dL (ref <150)

## 2015-07-10 NOTE — Progress Notes (Signed)
PCCM PROGRESS NOTE  ADMISSION DATE: 06/15/2015 CONSULT DATE: 06/16/2015 REFERRING PROVIDER: Hijazi  CC: Respiratory failure  SUBJECTIVE: Tolerated 8 hours of trach collar  No acute events.   OBJECTIVE: Vitals: T 98.9 HR 85 BP 118/46 O2 92%   General: chronically ill appearing, comfortable in bed HEENT: #6 trach, site clean and intact Cardiac: RRR, few PVC Chest: bilateral air entry, normal effort, no rhonchi Abd: soft, non tender, no masses Ext: no clubbing, cyanosis or edema Neuro: Follows commands, moves extremities, nods appropriately Skin: no rashes   CMP Latest Ref Rng 07/10/2015 07/07/2015 07/05/2015  Glucose 65 - 99 mg/dL 454(U109(H) 981(X104(H) 914(N112(H)  BUN 6 - 20 mg/dL 15 17 15   Creatinine 0.44 - 1.00 mg/dL 8.290.59 5.620.68 1.300.54  Sodium 135 - 145 mmol/L 135 137 136  Potassium 3.5 - 5.1 mmol/L 3.9 4.0 4.0  Chloride 101 - 111 mmol/L 95(L) 99(L) 100(L)  CO2 22 - 32 mmol/L 31 31 30   Calcium 8.9 - 10.3 mg/dL 9.4 9.5 9.4  Total Protein 6.5 - 8.1 g/dL 7.4 - -  Total Bilirubin 0.3 - 1.2 mg/dL 0.5 - -  Alkaline Phos 38 - 126 U/L 58 - -  AST 15 - 41 U/L 32 - -  ALT 14 - 54 U/L 36 - -     CBC Latest Ref Rng 07/07/2015 07/02/2015 06/26/2015  WBC 4.0 - 10.5 K/uL 5.4 3.9(L) 5.1  Hemoglobin 12.0 - 15.0 g/dL 8.6(V9.6(L) 7.8(I8.7(L) 6.9(G9.5(L)  Hematocrit 36.0 - 46.0 % 30.2(L) 28.6(L) 30.6(L)  Platelets 150 - 400 K/uL 270 322 380     11/21 ABdomen images reviwed> gas noted, no SBO, air in rectum 11/12 CXR > clear lung fields, normal cardiac sillhouette  STUDIES:  EVENTS: 08/23 Admit [facility] with PE, VDRF 09/21 Re-intubated 10/25 Extubated 10/27 [facility]intub[facility]  [facility]     DISCUSSION: 69 yo female with respiratory failure in setting of PE, failure to thrive, UTI, aspiration pneumonia, supranuclear palsy.  Has made great progress over the last few days, now remaining off of the vent for a week.  ASSESSMENT/PLAN:  Acute on chronic respiratory failure> resolved Failure to wean from  ventilator > resolved Tracheostomy status Hx of OSA. Plan: - downsize trach to #4 cuffless - OK for speaking valve - F/u CXR intermittently  Aspiration pneumonia> resolved Plan: - Abx per primary team  Supranuclear palsy Hx of PE Deconditioning with failure to thrive Plan: - Nutrition support - Push PT efforts   Heber CarolinaBrent Symphonie Schneiderman, MD [place] [facility]M Pager: [phone] Cell: [phone] After 3pm or if no response, call [phone]   07/10/2015, 12:15 PM

## 2015-07-11 ENCOUNTER — Other Ambulatory Visit (HOSPITAL_COMMUNITY): Payer: Medicare Other

## 2015-07-12 LAB — BASIC METABOLIC PANEL
ANION GAP: 6 (ref 5–15)
Anion gap: 8 (ref 5–15)
BUN: 12 mg/dL (ref 6–20)
BUN: 15 mg/dL (ref 6–20)
CHLORIDE: 97 mmol/L — AB (ref 101–111)
CHLORIDE: 97 mmol/L — AB (ref 101–111)
CO2: 34 mmol/L — AB (ref 22–32)
CO2: 30 mmol/L (ref 22–32)
Calcium: 9.7 mg/dL (ref 8.9–10.3)
Calcium: 9.6 mg/dL (ref 8.9–10.3)
Creatinine, Ser: 0.55 mg/dL (ref 0.44–1.00)
Creatinine, Ser: 0.74 mg/dL (ref 0.44–1.00)
GFR calc non Af Amer: >60 mL/min (ref >60)
GFR calc non Af Amer: >60 mL/min (ref >60)
Glucose, Bld: 84 mg/dL (ref 65–99)
Glucose, Bld: 134 mg/dL — ABNORMAL HIGH (ref 65–99)
POTASSIUM: 4.1 mmol/L (ref 3.5–5.1)
Potassium: 3.7 mmol/L (ref 3.5–5.1)
SODIUM: 135 mmol/L (ref 135–145)
Sodium: 137 mmol/L (ref 135–145)

## 2015-07-12 LAB — CBC
HCT: 34.3 % — ABNORMAL LOW (ref 36.0–46.0)
HEMOGLOBIN: 10.7 g/dL — AB (ref 12.0–15.0)
MCH: 27.6 pg (ref 26.0–34.0)
MCHC: 31.2 g/dL (ref 30.0–36.0)
MCV: 88.6 fL (ref 78.0–100.0)
Platelets: 292 10*3/uL (ref 150–400)
RBC: 3.87 MIL/uL (ref 3.87–5.11)
RDW: 14.7 % (ref 11.5–15.5)
WBC: 3.3 10*3/uL — ABNORMAL LOW (ref 4.0–10.5)

## 2015-07-13 LAB — PHOSPHORUS: PHOSPHORUS: 3.8 mg/dL (ref 2.5–4.6)

## 2015-07-13 LAB — MAGNESIUM: Magnesium: 2 mg/dL (ref 1.7–2.4)

## 2015-07-14 LAB — BASIC METABOLIC PANEL
Anion gap: 8 (ref 5–15)
BUN: 11 mg/dL (ref 6–20)
CALCIUM: 9.6 mg/dL (ref 8.9–10.3)
CO2: 31 mmol/L (ref 22–32)
Chloride: 102 mmol/L (ref 101–111)
Creatinine, Ser: 0.68 mg/dL (ref 0.44–1.00)
GLUCOSE: 99 mg/dL (ref 65–99)
Potassium: 4 mmol/L (ref 3.5–5.1)
Sodium: 141 mmol/L (ref 135–145)

## 2015-07-17 DIAGNOSIS — J9600 Acute respiratory failure, unspecified whether with hypoxia or hypercapnia: Secondary | ICD-10-CM | POA: Diagnosis not present

## 2015-07-17 DIAGNOSIS — Z515 Encounter for palliative care: Secondary | ICD-10-CM

## 2015-07-17 DIAGNOSIS — Z930 Tracheostomy status: Secondary | ICD-10-CM | POA: Diagnosis not present

## 2015-07-17 LAB — COMPREHENSIVE METABOLIC PANEL
ALK PHOS: 45 U/L (ref 38–126)
ALT: 26 U/L (ref 14–54)
AST: 30 U/L (ref 15–41)
Albumin: 3.7 g/dL (ref 3.5–5.0)
Anion gap: 6 (ref 5–15)
BILIRUBIN TOTAL: 0.4 mg/dL (ref 0.3–1.2)
BUN: 12 mg/dL (ref 6–20)
CALCIUM: 9.5 mg/dL (ref 8.9–10.3)
CO2: 32 mmol/L (ref 22–32)
CREATININE: 0.55 mg/dL (ref 0.44–1.00)
Chloride: 101 mmol/L (ref 101–111)
GFR calc Af Amer: >60 mL/min (ref >60)
Glucose, Bld: 100 mg/dL — ABNORMAL HIGH (ref 65–99)
POTASSIUM: 3.9 mmol/L (ref 3.5–5.1)
Sodium: 139 mmol/L (ref 135–145)
TOTAL PROTEIN: 7 g/dL (ref 6.5–8.1)

## 2015-07-17 LAB — DIFFERENTIAL
Basophils Absolute: 0 10*3/uL (ref 0.0–0.1)
Basophils Relative: 1 %
EOS ABS: 0 10*3/uL (ref 0.0–0.7)
EOS PCT: 1 %
LYMPHS ABS: 1.8 10*3/uL (ref 0.7–4.0)
Lymphocytes Relative: 42 %
MONO ABS: 0.7 10*3/uL (ref 0.1–1.0)
MONOS PCT: 17 %
Neutro Abs: 1.6 10*3/uL — ABNORMAL LOW (ref 1.7–7.7)
Neutrophils Relative %: 39 %

## 2015-07-17 LAB — TRIGLYCERIDES
Triglycerides: 27 mg/dL (ref <150)
Triglycerides: 37 mg/dL (ref <150)

## 2015-07-17 LAB — PREALBUMIN: PREALBUMIN: 12.3 mg/dL — AB (ref 18–38)

## 2015-07-17 LAB — MAGNESIUM
Magnesium: 1.8 mg/dL (ref 1.7–2.4)
Magnesium: 2 mg/dL (ref 1.7–2.4)

## 2015-07-17 LAB — PHOSPHORUS: Phosphorus: 3.3 mg/dL (ref 2.5–4.6)

## 2015-07-17 NOTE — Consult Note (Signed)
Name: Kaylee Olsen MRN: [id number] DOB: Jan 03, 1946    ADMISSION DATE:  06/15/2015 CONSULTATION DATE:  07/17/2015  REFERRING MD :  [facility]  CHIEF COMPLAINT:  Respiratory failure and tracheostomy placement.  BRIEF PATIENT DESCRIPTION: 69 year old female with PMH of a PE and respiratry failure that was intubated twice and finally a trach was placed during this admission.  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  [facility].  She is requesting that the tracheostomy be removed and that she goes home.  PCCM was consulted to opine on the decannulation.  HISTORY OF PRESENT ILLNESS:  69 year old female with PMH of a PE and respiratry failure that was intubated twice and finally a trach was placed during this admission.  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  [facility].  She is requesting that the tracheostomy be removed and that she goes home.  PCCM was consulted to opine on the decannulation.  PAST MEDICAL HISTORY :   has no past medical history on file.  has past surgical history that includes Tracheostomy tube placement (N/A, 06/23/2015). Prior to Admission medications   Medication Sig Start Date End Date Taking? Authorizing Provider  acetaminophen (TYLENOL) 650 MG suppository Place 650 mg rectally every 6 (six) hours as needed for mild pain or fever.   Yes Historical Provider, MD  albuterol (PROVENTIL) (2.5 MG/3ML) 0.083% nebulizer solution Take 2.5 mg by nebulization every 6 (six) hours as needed for wheezing or shortness of breath.   Yes Historical Provider, MD  enoxaparin (LOVENOX) 60 MG/0.6ML injection Inject 52 mg into the skin every 12 (twelve) hours.   Yes Historical Provider, MD  famotidine (PEPCID) 20 MG tablet Take 20 mg by mouth 2 (two) times daily.   Yes Historical Provider, MD  insulin lispro (HUMALOG) 100 UNIT/ML injection Inject 1 Units into  the skin every 6 (six) hours.   Yes Historical Provider, MD  LORazepam (ATIVAN) 2 MG/ML concentrated solution Take 1 mg by mouth every 4 (four) hours as needed for anxiety.   Yes Historical Provider, MD  morphine 2 MG/ML injection Inject 2 mg into the vein every 6 (six) hours as needed (pain).   Yes Historical Provider, MD  nitroGLYCERIN (NITROSTAT) 0.4 MG SL tablet Place 0.4 mg under the tongue every 5 (five) minutes as needed for chest pain.   Yes Historical Provider, MD  ondansetron (ZOFRAN-ODT) 4 MG disintegrating tablet Take 4 mg by mouth every 8 (eight) hours as needed for nausea or vomiting.   Yes Historical Provider, MD  oxyCODONE (OXY IR/ROXICODONE) 5 MG immediate release tablet Take 5 mg by mouth every 6 (six) hours as needed for severe pain.   Yes Historical Provider, MD  potassium chloride 10 MEQ/50ML Inject 10 mEq into the vein as needed (low potassium).   Yes Historical Provider, MD  senna-docusate (SENOKOT-S) 8.6-50 MG tablet Take 1 tablet by mouth 2 (two) times daily as needed for mild constipation.   Yes Historical Provider, MD   No Known Allergies  FAMILY HISTORY:  family history is not on file. SOCIAL HISTORY:    REVIEW OF SYSTEMS:   Constitutional: Negative for fever, chills, weight loss, malaise/fatigue and diaphoresis.  HENT: Negative for hearing loss, ear pain, nosebleeds, congestion, sore throat, neck pain, tinnitus and ear discharge.   Eyes: Negative for blurred vision, double vision, photophobia, pain, discharge and redness.  Respiratory: Negative  for cough, hemoptysis, sputum production, shortness of breath, wheezing and stridor.  Occasional trach site discomfort. Cardiovascular: Negative for chest pain, palpitations, orthopnea, claudication, leg swelling and PND.  Gastrointestinal: Negative for heartburn, nausea, vomiting, abdominal pain, diarrhea, constipation, blood in stool and melena.  Genitourinary: Negative for dysuria, urgency, frequency, hematuria and flank  pain.  Musculoskeletal: Negative for myalgias, back pain, joint pain and falls.  Skin: Negative for itching and rash.  Neurological: Negative for dizziness, tingling, tremors, sensory change, speech change, focal weakness, seizures, loss of consciousness, weakness and headaches.  Endo/Heme/Allergies: Negative for environmental allergies and polydipsia. Does not bruise/bleed easily.  SUBJECTIVE: Trach site discomfort at times, requesting decannulation.  VITAL SIGNS:  Sat 98% on 21% collar, HR 80, RR 18, BP 111/56.  PHYSICAL EXAMINATION: General:  Chronically ill appearing female, NAD. Neuro:  Unable to gaze upwards but otherwise moving all ext to command. HEENT:  [place]/AT, PERRL, EOM limited as above, trach clean. Cardiovascular:  RRR, Nl S1/S2, -M/R/G. Lungs:  CTA bilaterally. Abdomen:  Soft, NT, ND and +BS. Musculoskeletal:  -edema and -tenderness. Skin:  Thin but intact.   Recent Labs Lab 07/12/15 1313 07/14/15 0410 07/17/15 0536  NA 135 141 139  K 4.1 4.0 3.9  CL 97* 102 101  CO2 30 31 32  BUN 15 11 12   CREATININE 0.74 0.68 0.55  GLUCOSE 134* 99 100*    Recent Labs Lab 07/12/15 0533  HGB 10.7*  HCT 34.3*  WBC 3.3*  PLT 292   I reviewed CXR myself, trach in good position.  ASSESSMENT / PLAN:  69 year old female with progressive deterioration of her neuro status that now has progressed to the point of inability to protect her airway and required a tracheostomy.  The patient and husband are requesting decannulation.  I explained to patient at length that her disorder is progressive and without the trach she will likely die eventually since she will be unlikely able to protect her airway.  She expressed understanding of that and wished it removed regardless.  I informed her that DNR status would be a requirement prior to decannulation and she was ok with that.   - DNR status.  - Decannulate on Wednesday.  - If fails then will begin comfort measures in the hospital.  -  If survives til Friday will discharge.  - Recommend involvement of hospice prior to discharge.  PCCM will be available PRN at this point.  Alyson ReedyWesam G. Iliyah Bui, M.D. [facility]. Pager: [phone]. After hours pager: [phone].  07/17/2015, 2:16 PM

## 2015-07-18 LAB — BLOOD GAS, ARTERIAL
ACID-BASE EXCESS: 4 mmol/L — AB (ref 0.0–2.0)
Bicarbonate: 28.1 mEq/L — ABNORMAL HIGH (ref 20.0–24.0)
O2 SAT: 92.7 %
PH ART: 7.431 (ref 7.350–7.450)
Patient temperature: 98.6
TCO2: 29.5 mmol/L (ref 0–100)
pCO2 arterial: 43 mmHg (ref 35.0–45.0)
pO2, Arterial: 67.9 mmHg — ABNORMAL LOW (ref 80.0–100.0)

## 2015-07-18 LAB — CBC
HCT: 30.9 % — ABNORMAL LOW (ref 36.0–46.0)
Hemoglobin: 9.5 g/dL — ABNORMAL LOW (ref 12.0–15.0)
MCH: 27.1 pg (ref 26.0–34.0)
MCHC: 30.7 g/dL (ref 30.0–36.0)
MCV: 88 fL (ref 78.0–100.0)
PLATELETS: 303 10*3/uL (ref 150–400)
RBC: 3.51 MIL/uL — ABNORMAL LOW (ref 3.87–5.11)
RDW: 14.8 % (ref 11.5–15.5)
WBC: 3.8 10*3/uL — ABNORMAL LOW (ref 4.0–10.5)

## 2015-07-19 LAB — BASIC METABOLIC PANEL
ANION GAP: 6 (ref 5–15)
BUN: 7 mg/dL (ref 6–20)
CALCIUM: 9.3 mg/dL (ref 8.9–10.3)
CO2: 30 mmol/L (ref 22–32)
CREATININE: 0.49 mg/dL (ref 0.44–1.00)
Chloride: 101 mmol/L (ref 101–111)
GFR calc Af Amer: >60 mL/min (ref >60)
GFR calc non Af Amer: >60 mL/min (ref >60)
GLUCOSE: 92 mg/dL (ref 65–99)
Potassium: 3.6 mmol/L (ref 3.5–5.1)
Sodium: 137 mmol/L (ref 135–145)

## 2015-07-20 DIAGNOSIS — Z930 Tracheostomy status: Secondary | ICD-10-CM | POA: Diagnosis not present

## 2015-07-20 DIAGNOSIS — J690 Pneumonitis due to inhalation of food and vomit: Secondary | ICD-10-CM | POA: Diagnosis not present

## 2015-07-20 LAB — PHOSPHORUS: Phosphorus: 4 mg/dL (ref 2.5–4.6)

## 2015-07-20 LAB — MAGNESIUM: Magnesium: 1.9 mg/dL (ref 1.7–2.4)

## 2015-07-20 NOTE — Progress Notes (Signed)
   Name: Kaylee Olsen MRN: [id number] DOB: 11/05/1945    ADMISSION DATE:  06/15/2015 CONSULTATION DATE:  07/17/2015  REFERRING MD :  [facility]  CHIEF COMPLAINT:  Respiratory failure and tracheostomy placement.  BRIEF PATIENT DESCRIPTION: 69 year old female with PMH of a PE and respiratry failure that was intubated twice and finally a trach was placed during this admission.  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  [facility]  She is requesting that the tracheostomy be removed and that she goes home.  PCCM was consulted to opine on the decannulation.  HISTORY OF PRESENT ILLNESS:  69 year old female with PMH of a PE and respiratry failure that was intubated twice and finally a trach was placed during this admission.  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  [facility].  She is requesting that the tracheostomy be removed and that she goes home.  PCCM was consulted to opine on the decannulation.  SUBJECTIVE: Self decannulated 11/30 and doing well.  VITAL SIGNS:  Sat 98% on 21% collar, HR 80, RR 18, BP 111/56.  PHYSICAL EXAMINATION: General:  Chronically ill appearing female, NAD. Neuro:  Unable to gaze upwards but otherwise moving all ext to command. HEENT:  [place]/AT, PERRL, EOM limited as above, trach stoma. Cardiovascular:  RRR, Nl S1/S2, -M/R/G. Lungs:  CTA bilaterally. Abdomen:  Soft, NT, ND and +BS. Musculoskeletal:  -edema and -tenderness. Skin:  Thin but intact.   Recent Labs Lab 07/14/15 0410 07/17/15 0536 07/19/15 0502  NA 141 139 137  K 4.0 3.9 3.6  CL 102 101 101  CO2 31 32 30  BUN 11 12 7   CREATININE 0.68 0.55 0.49  GLUCOSE 99 100* 92    Recent Labs Lab 07/18/15 0640  HGB 9.5*  HCT 30.9*  WBC 3.8*  PLT 303   I reviewed CXR myself, no acute findings.  ASSESSMENT / PLAN:  69 year old female with progressive deterioration of  her neuro status that now has progressed to the point of inability to protect her airway and required a tracheostomy.  The patient and husband are requesting decannulation.  I explained to patient at length that her disorder is progressive and without the trach she will likely die eventually since she will be unlikely able to protect her airway.  She expressed understanding of that and wished it removed regardless.  I informed her that DNR status would be a requirement prior to decannulation and she was ok with that.   - DNR status.  - Titrate O2 for sat of 88-92%.  - If fails then will begin comfort measures in the hospital.  - If survives til Friday will discharge.  - Recommend involvement of hospice prior to discharge.  PCCM will be available PRN at this point.  Alyson ReedyWesam G. Yacoub, M.D. [facility]. Pager: [phone]. After hours pager: [phone].  07/20/2015, 1:31 PM

## 2015-07-21 LAB — CBC
HCT: 33.9 % — ABNORMAL LOW (ref 36.0–46.0)
Hemoglobin: 10.3 g/dL — ABNORMAL LOW (ref 12.0–15.0)
MCH: 26.8 pg (ref 26.0–34.0)
MCHC: 30.4 g/dL (ref 30.0–36.0)
MCV: 88.1 fL (ref 78.0–100.0)
PLATELETS: 308 10*3/uL (ref 150–400)
RBC: 3.85 MIL/uL — AB (ref 3.87–5.11)
RDW: 14.6 % (ref 11.5–15.5)
WBC: 3 10*3/uL — AB (ref 4.0–10.5)

## 2015-07-21 LAB — BASIC METABOLIC PANEL
Anion gap: 8 (ref 5–15)
BUN: 8 mg/dL (ref 6–20)
CHLORIDE: 98 mmol/L — AB (ref 101–111)
CO2: 31 mmol/L (ref 22–32)
CREATININE: 0.54 mg/dL (ref 0.44–1.00)
Calcium: 9.4 mg/dL (ref 8.9–10.3)
Glucose, Bld: 96 mg/dL (ref 65–99)
POTASSIUM: 4 mmol/L (ref 3.5–5.1)
SODIUM: 137 mmol/L (ref 135–145)

## 2015-07-23 LAB — PROTIME-INR
INR: 1.08 (ref 0.00–1.49)
Prothrombin Time: 14.2 seconds (ref 11.6–15.2)

## 2015-07-24 LAB — DIFFERENTIAL
BASOS PCT: 2 %
Basophils Absolute: 0.1 10*3/uL (ref 0.0–0.1)
EOS PCT: 1 %
Eosinophils Absolute: 0 10*3/uL (ref 0.0–0.7)
LYMPHS ABS: 2.1 10*3/uL (ref 0.7–4.0)
Lymphocytes Relative: 55 %
MONOS PCT: 17 %
Monocytes Absolute: 0.6 10*3/uL (ref 0.1–1.0)
NEUTROS ABS: 0.9 10*3/uL — AB (ref 1.7–7.7)
Neutrophils Relative %: 25 %

## 2015-07-24 LAB — COMPREHENSIVE METABOLIC PANEL
ALT: 16 U/L (ref 14–54)
AST: 27 U/L (ref 15–41)
Albumin: 3.6 g/dL (ref 3.5–5.0)
Alkaline Phosphatase: 47 U/L (ref 38–126)
Anion gap: 8 (ref 5–15)
BUN: 11 mg/dL (ref 6–20)
CHLORIDE: 101 mmol/L (ref 101–111)
CO2: 30 mmol/L (ref 22–32)
Calcium: 9.3 mg/dL (ref 8.9–10.3)
Creatinine, Ser: 0.57 mg/dL (ref 0.44–1.00)
Glucose, Bld: 88 mg/dL (ref 65–99)
POTASSIUM: 3.7 mmol/L (ref 3.5–5.1)
SODIUM: 139 mmol/L (ref 135–145)
Total Bilirubin: 0.3 mg/dL (ref 0.3–1.2)
Total Protein: 7 g/dL (ref 6.5–8.1)

## 2015-07-24 LAB — PROTIME-INR
INR: 1.31 (ref 0.00–1.49)
Prothrombin Time: 16.4 seconds — ABNORMAL HIGH (ref 11.6–15.2)

## 2015-07-24 LAB — TRIGLYCERIDES
TRIGLYCERIDES: 43 mg/dL (ref <150)
Triglycerides: 28 mg/dL (ref <150)

## 2015-07-24 LAB — PREALBUMIN: PREALBUMIN: 13.1 mg/dL — AB (ref 18–38)

## 2015-07-24 LAB — PATHOLOGIST SMEAR REVIEW

## 2015-07-24 LAB — MAGNESIUM
MAGNESIUM: 1.9 mg/dL (ref 1.7–2.4)
Magnesium: 1.9 mg/dL (ref 1.7–2.4)

## 2015-07-24 LAB — PHOSPHORUS: PHOSPHORUS: 4.3 mg/dL (ref 2.5–4.6)

## 2015-07-25 LAB — PROTIME-INR
INR: 1.96 — ABNORMAL HIGH (ref 0.00–1.49)
INR: 2.01 — AB (ref 0.00–1.49)
PROTHROMBIN TIME: 22.6 s — AB (ref 11.6–15.2)
Prothrombin Time: 22.2 seconds — ABNORMAL HIGH (ref 11.6–15.2)

## 2017-03-27 IMAGING — CR DG CHEST 1V PORT
1 series · 1 of 1 positions shown · non-contrast
Comparison: 06/16/2015 .

CLINICAL DATA: Respiratory failure.

EXAM:
PORTABLE CHEST 1 VIEW

[AP]
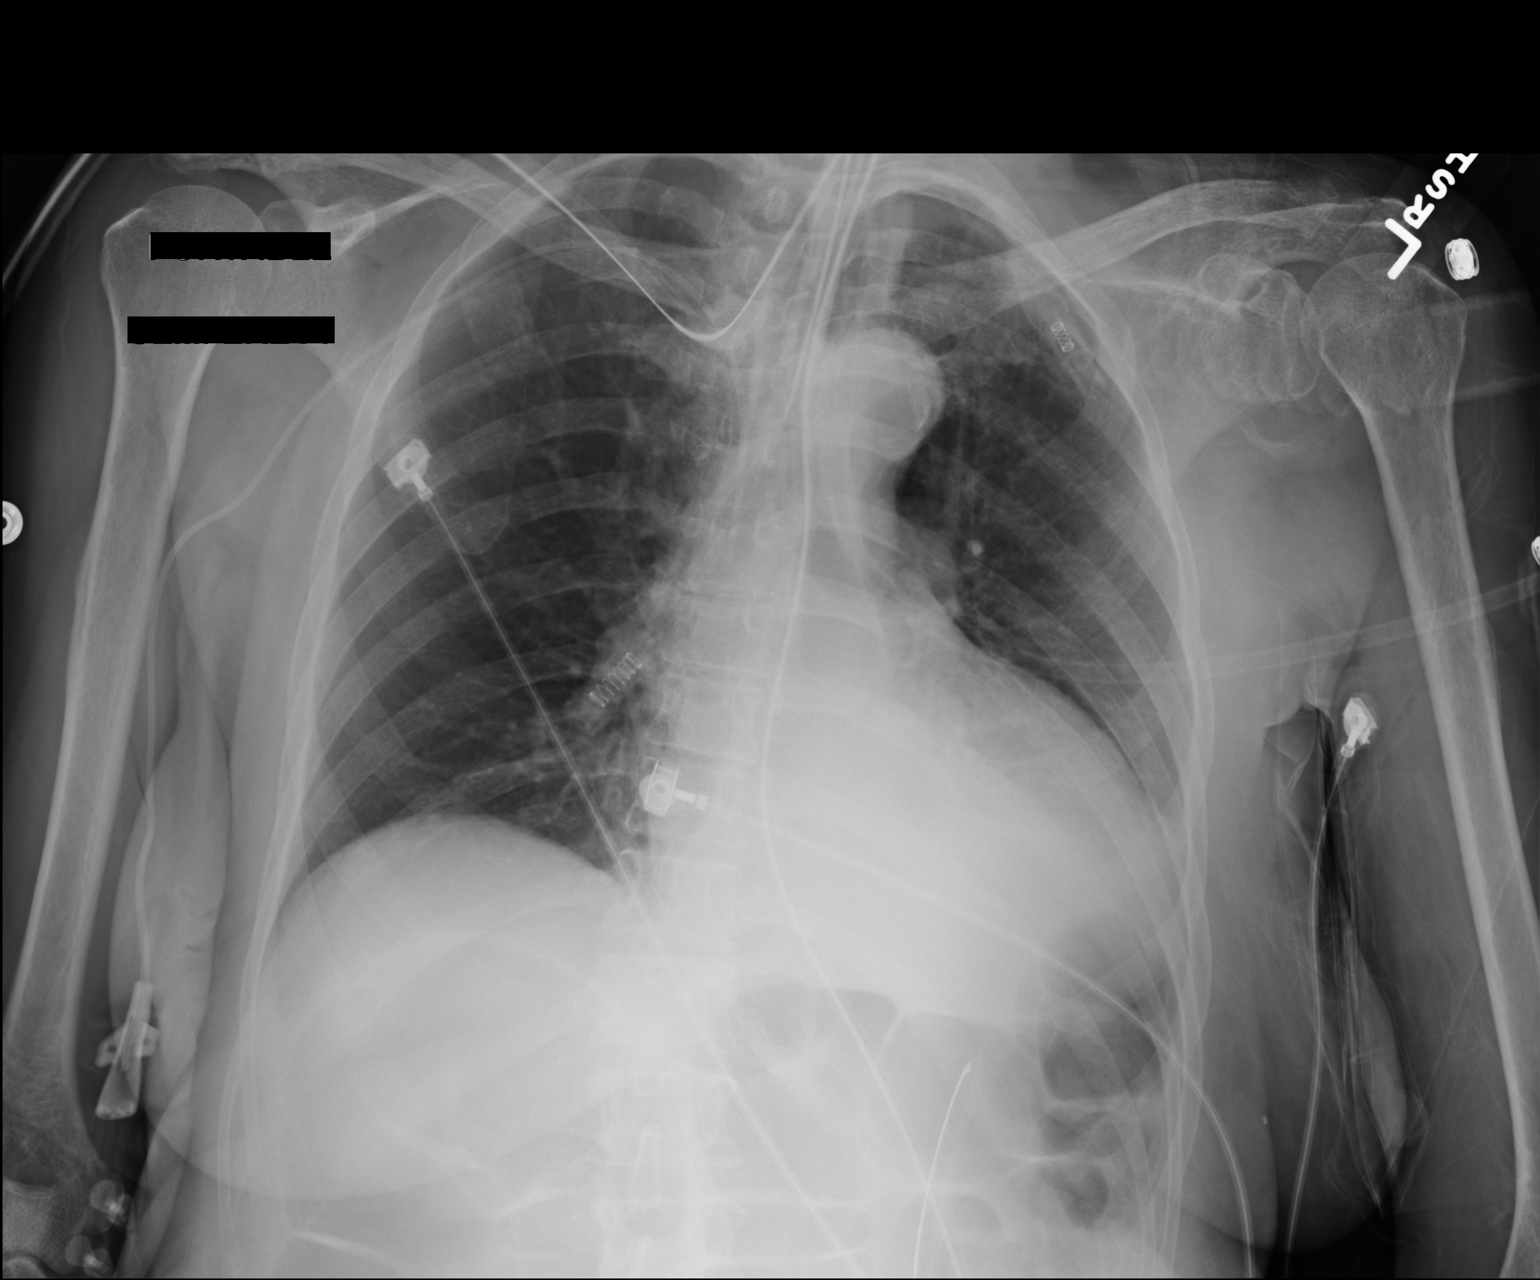

[1 of 1 positions shown; findings below may reference images not displayed]

FINDINGS: Endotracheal tube and NG tube in stable position. Right PICC line
stable position. Mediastinum and hilar structures are stable. Left
lower lobe atelectasis and/or through noted. Interim partial
clearing of right lower lobe atelectasis. Small left pleural
effusion. No pneumothorax.
IMPRESSION: 1. Lines and tubes in stable position.
2. Left lower lobe atelectasis and/or infiltrate with small left
pleural effusion. Interim partial clearing of right lower lobe
atelectasis.

## 2017-03-31 IMAGING — CR DG ABD PORTABLE 1V
1 series · 1 of 1 positions shown · non-contrast
Comparison: Portable abdomen x-ray earlier today 4348 hr.

CLINICAL DATA: Bedside feeding tube placement.

EXAM:
PORTABLE ABDOMEN - 1 VIEW 9486 hr:

[AP]
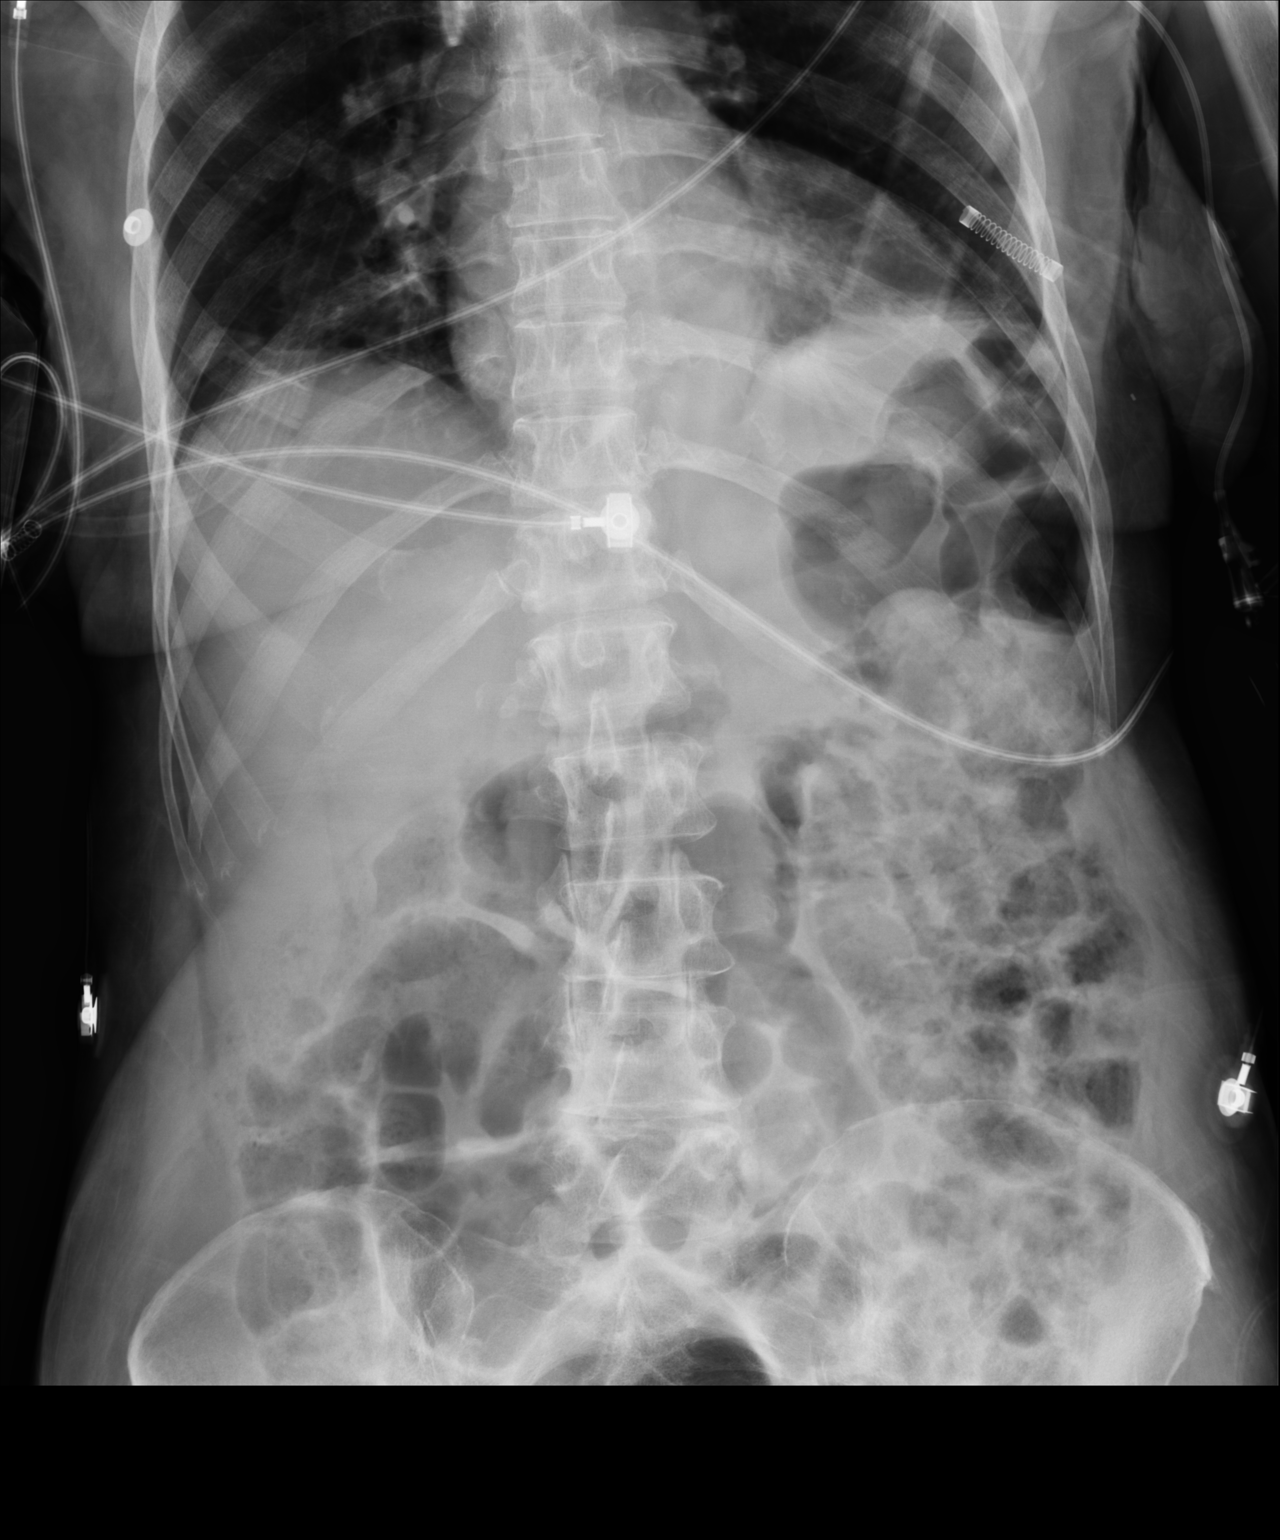

[1 of 1 positions shown; findings below may reference images not displayed]

FINDINGS: A feeding tube is not visible overlying the lower chest or abdomen.

Interval decrease in the caliber of the sigmoid colon since earlier
today. Bowel gas pattern now normal with gas and upper normal
caliber colon and gas throughout normal caliber small bowel.
IMPRESSION: 1. A feeding tube is not visible overlying the lower chest or
abdomen.
2. Bowel gas pattern now normal.  No acute abdominal abnormality.

## 2017-03-31 IMAGING — CR DG ABD PORTABLE 1V
1 series · 1 of 1 positions shown · non-contrast
Comparison: 06/21/2015

CLINICAL DATA: Colonic abnormality.

EXAM:
PORTABLE ABDOMEN - 1 VIEW

[AP]
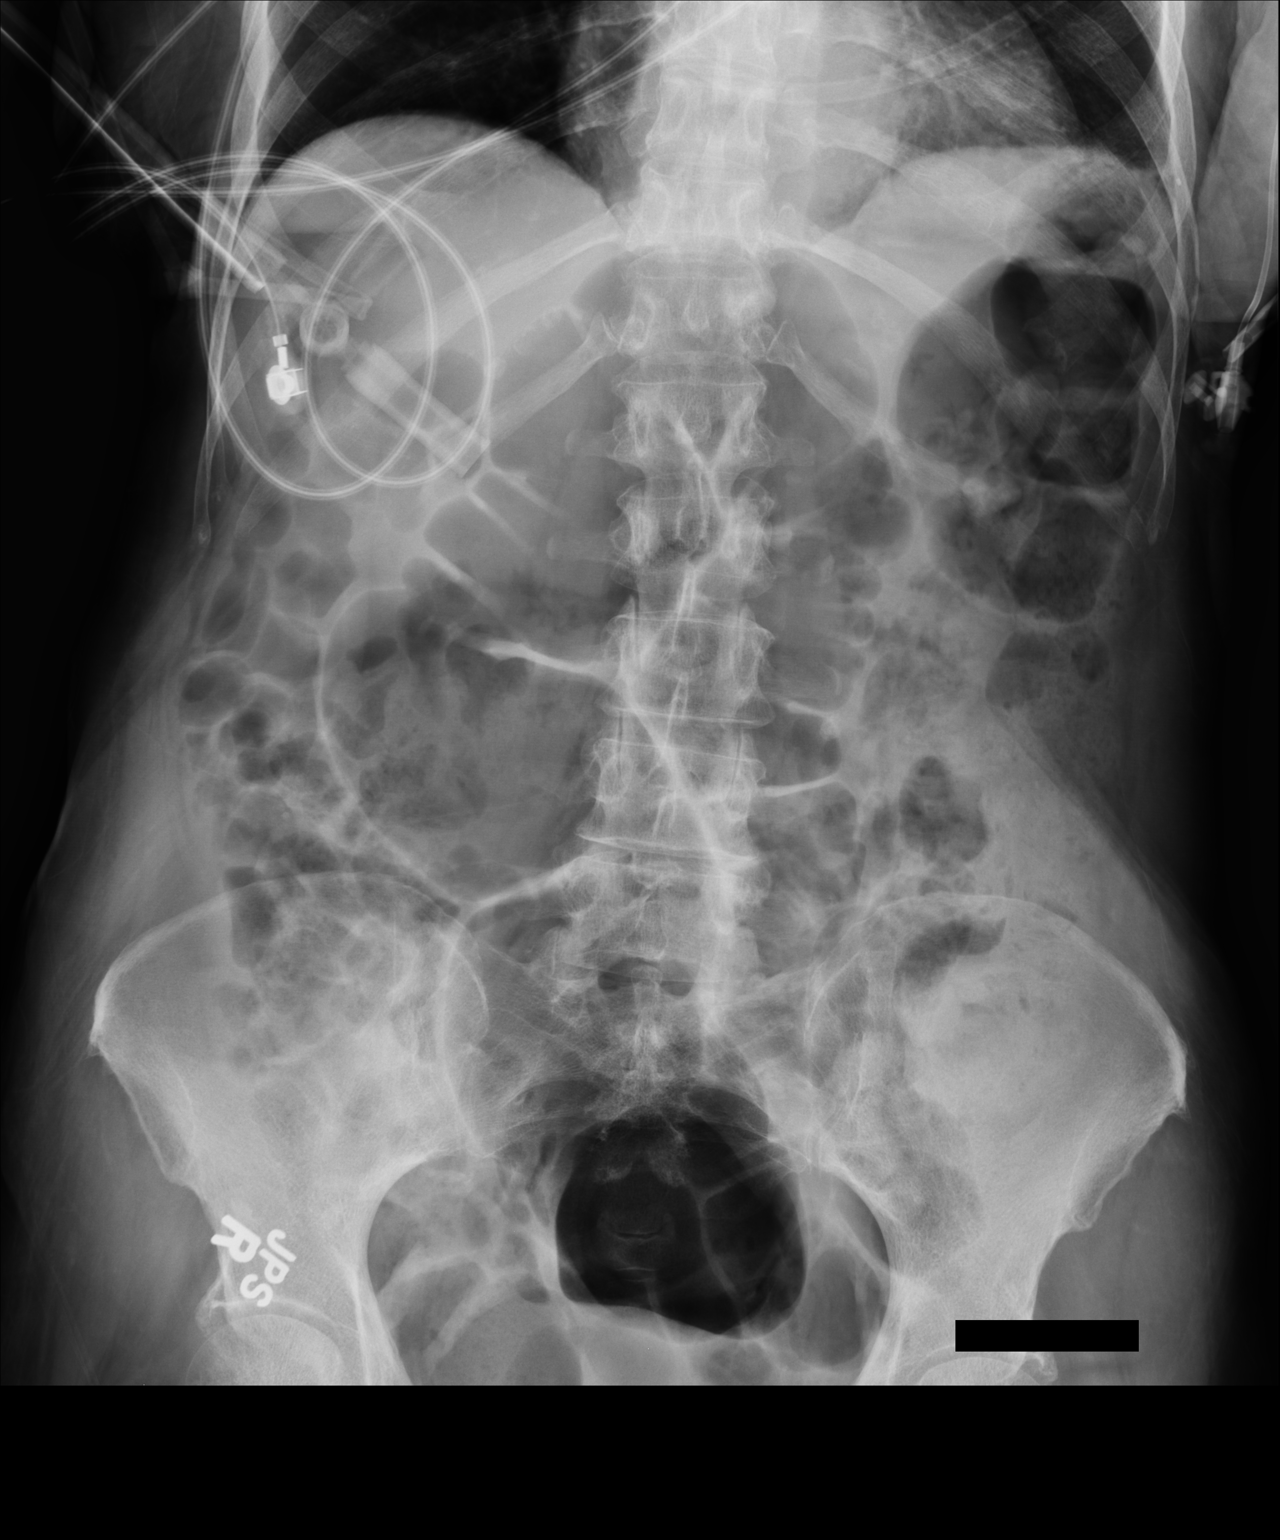

[1 of 1 positions shown; findings below may reference images not displayed]

FINDINGS: Nasogastric tube is been removed. There is increase gaseous
distention of the colon consistent with ileus. No further suggestion
of over colonic wall thickening. The colon is likely very redundant.
No small bowel dilatation identified with some air seen in
nondilated small bowel loops.
IMPRESSION: Increased gaseous distention of the colon consistent with ileus.
There also likely is a component of ileus involving small bowel. No
gross bowel obstruction or further suggestion of colonic wall
thickening.

## 2017-03-31 IMAGING — CR DG CHEST 1V PORT
1 series · 1 of 1 positions shown · non-contrast
Comparison: 06/21/2015 chest radiograph

CLINICAL DATA: PICC placed

EXAM:
PORTABLE CHEST 1 VIEW

[AP]
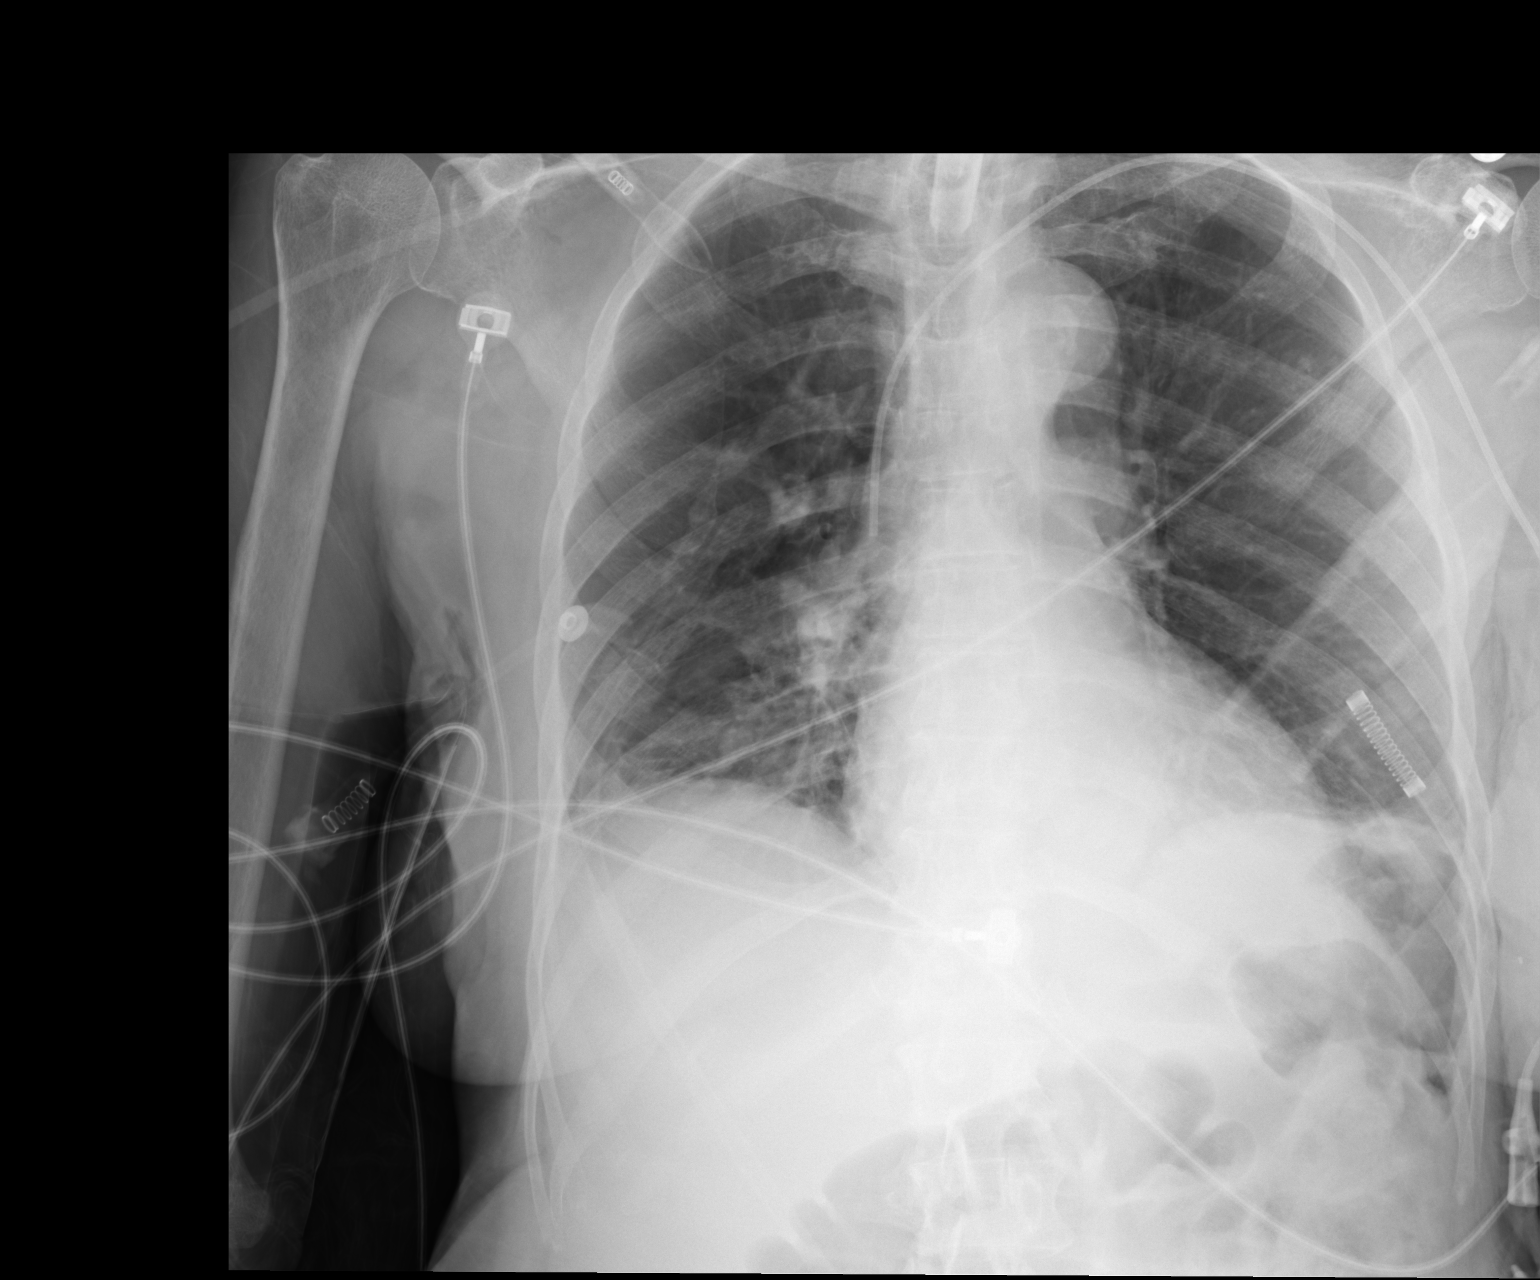

[1 of 1 positions shown; findings below may reference images not displayed]

FINDINGS: Left PICC terminates in the lower third of the superior vena cava.
Tracheostomy tube tip overlies the tracheal air column just below
the thoracic inlet. Stable cardiomediastinal silhouette with normal
heart size. No pneumothorax. No pleural effusion. No pulmonary
edema. Mild bibasilar atelectasis.
IMPRESSION: 1. Left PICC terminates in the lower third of the superior vena
cava.
2. Mild bibasilar atelectasis.

## 2017-04-01 IMAGING — CR DG ABD PORTABLE 1V
1 series · 1 of 1 positions shown · non-contrast
Comparison: Prior abdominal radiograph 06/23/2015

CLINICAL DATA: 69-year-old female status post nasogastric tube
placement

EXAM:
PORTABLE ABDOMEN - 1 VIEW

[AP]
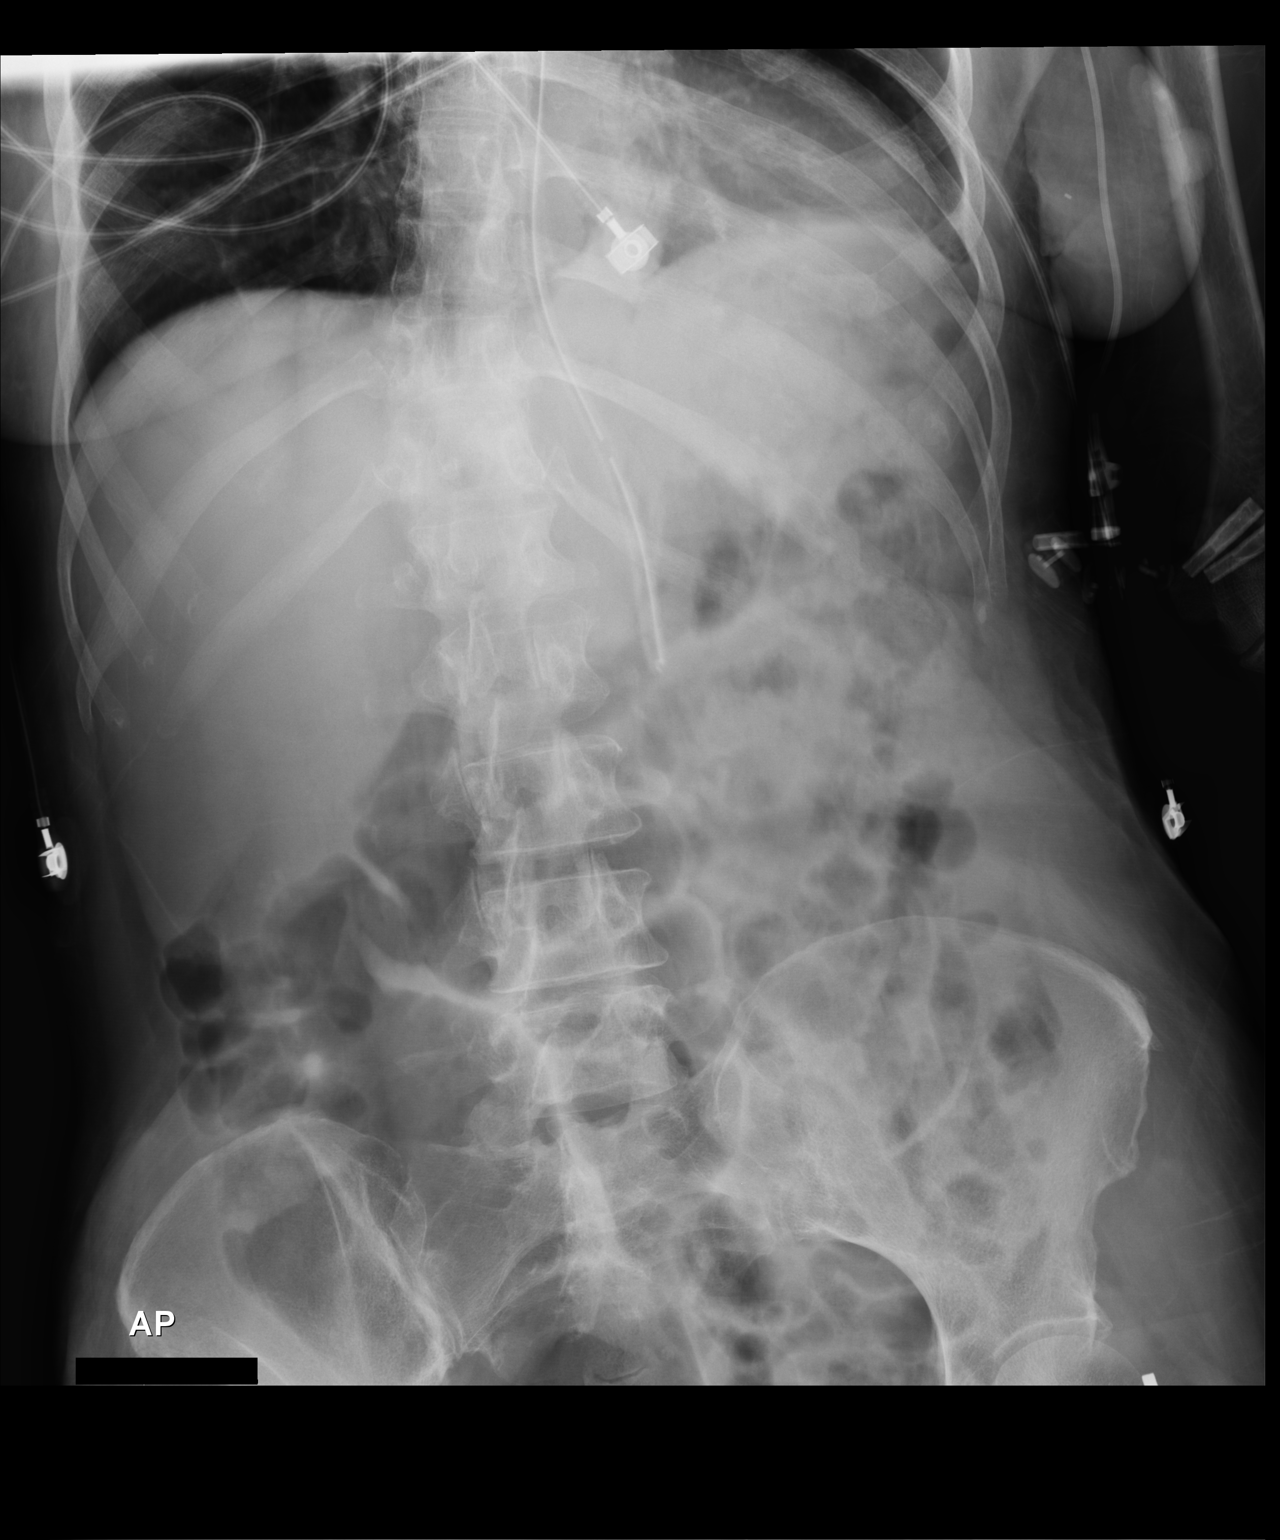

[1 of 1 positions shown; findings below may reference images not displayed]

FINDINGS: Interval placement of a nasogastric tube. The tip of the tube
overlies the gastric body in good position. Persistent left lower
lobe atelectasis. The bowel gas pattern is not obstructed. Gas is
noted in multiple loops of nondilated small bowel. The right lung
base is clear. No acute osseous abnormality.
IMPRESSION: The tip of the nasogastric tube projects over the mid gastric body
in good position.

## 2018-02-16 DEATH — deceased
